# Patient Record
Sex: Female | Born: 1965 | Race: White | Hispanic: No | Smoking: Former smoker
Health system: Southern US, Community
[De-identification: ages and names within clinical notes are randomized; demographics above are authoritative.]

## PROBLEM LIST (undated history)

## (undated) DIAGNOSIS — D509 Iron deficiency anemia, unspecified: Secondary | ICD-10-CM

## (undated) DIAGNOSIS — K219 Gastro-esophageal reflux disease without esophagitis: Secondary | ICD-10-CM

## (undated) HISTORY — PX: FRACTURE SURGERY: SHX138

---

## 2005-04-14 ENCOUNTER — Encounter: Payer: Self-pay | Admitting: Family Medicine

## 2005-06-10 ENCOUNTER — Ambulatory Visit: Payer: Self-pay | Admitting: Family Medicine

## 2005-06-17 ENCOUNTER — Ambulatory Visit: Payer: Self-pay | Admitting: Family Medicine

## 2005-08-18 ENCOUNTER — Ambulatory Visit: Payer: Self-pay | Admitting: Family Medicine

## 2005-08-25 ENCOUNTER — Other Ambulatory Visit: Admission: RE | Admit: 2005-08-25 | Discharge: 2005-08-25 | Payer: Self-pay | Admitting: Family Medicine

## 2005-08-25 ENCOUNTER — Ambulatory Visit: Payer: Self-pay | Admitting: Family Medicine

## 2006-12-23 ENCOUNTER — Encounter: Payer: Self-pay | Admitting: Family Medicine

## 2006-12-23 DIAGNOSIS — E785 Hyperlipidemia, unspecified: Secondary | ICD-10-CM

## 2006-12-23 DIAGNOSIS — F17200 Nicotine dependence, unspecified, uncomplicated: Secondary | ICD-10-CM

## 2006-12-23 DIAGNOSIS — N949 Unspecified condition associated with female genital organs and menstrual cycle: Secondary | ICD-10-CM

## 2014-01-17 ENCOUNTER — Emergency Department (HOSPITAL_BASED_OUTPATIENT_CLINIC_OR_DEPARTMENT_OTHER): Payer: Self-pay

## 2014-01-17 ENCOUNTER — Emergency Department (HOSPITAL_BASED_OUTPATIENT_CLINIC_OR_DEPARTMENT_OTHER)
Admission: EM | Admit: 2014-01-17 | Discharge: 2014-01-17 | Disposition: A | Discharge status: Home / Self Care | Payer: Self-pay | Attending: Emergency Medicine | Admitting: Emergency Medicine

## 2014-01-17 ENCOUNTER — Encounter (HOSPITAL_BASED_OUTPATIENT_CLINIC_OR_DEPARTMENT_OTHER): Payer: Self-pay | Admitting: Emergency Medicine

## 2014-01-17 DIAGNOSIS — Y929 Unspecified place or not applicable: Secondary | ICD-10-CM | POA: Insufficient documentation

## 2014-01-17 DIAGNOSIS — W06XXXA Fall from bed, initial encounter: Secondary | ICD-10-CM | POA: Insufficient documentation

## 2014-01-17 DIAGNOSIS — Y93E5 Activity, floor mopping and cleaning: Secondary | ICD-10-CM | POA: Insufficient documentation

## 2014-01-17 DIAGNOSIS — Z720 Tobacco use: Secondary | ICD-10-CM | POA: Insufficient documentation

## 2014-01-17 DIAGNOSIS — Z8719 Personal history of other diseases of the digestive system: Secondary | ICD-10-CM | POA: Insufficient documentation

## 2014-01-17 DIAGNOSIS — S52501A Unspecified fracture of the lower end of right radius, initial encounter for closed fracture: Secondary | ICD-10-CM | POA: Insufficient documentation

## 2014-01-17 HISTORY — DX: Gastro-esophageal reflux disease without esophagitis: K21.9

## 2014-01-17 MED ORDER — PROMETHAZINE HCL 25 MG PO TABS: 25.0000 mg | ORAL_TABLET | Freq: Four times a day (QID) | ORAL | Status: DC | PRN

## 2014-01-17 MED ORDER — HYDROMORPHONE HCL 1 MG/ML IJ SOLN
2.0000 mg | Freq: Once | INTRAMUSCULAR | Status: AC
  Administered 2014-01-17: 2 mg via INTRAMUSCULAR
  Filled 2014-01-17: qty 2

## 2014-01-17 MED ORDER — OXYCODONE-ACETAMINOPHEN 5-325 MG PO TABS
2.0000 | ORAL_TABLET | Freq: Once | ORAL | Status: AC
  Administered 2014-01-17: 2 via ORAL
  Filled 2014-01-17: qty 2

## 2014-01-17 MED ORDER — OXYCODONE-ACETAMINOPHEN 5-325 MG PO TABS: 2.0000 | ORAL_TABLET | ORAL | Status: DC | PRN

## 2014-01-17 NOTE — ED Notes (Signed)
Pt d/c with ride- taken via wheelchair to pharmacy to pick up meds and then to car- ice pack given for home use

## 2014-01-17 NOTE — Discharge Instructions (Signed)
Wear splint as applied.  Followup with Dr. Amanda PeaGramig tomorrow at 11 AM at his office, unless you hear otherwise.  Apply ice for 20 minutes every 2 hours while awake for the next 2 days.  Percocet as prescribed as needed for pain.   Radial Fracture You have a broken bone (fracture) of the forearm. This is the part of your arm between the elbow and your wrist. Your forearm is made up of two bones. These are the radius and ulna. Your fracture is in the radial shaft. This is the bone in your forearm located on the thumb side. A cast or splint is used to protect and keep your injured bone from moving. The cast or splint will be on generally for about 5 to 6 weeks, with individual variations. HOME CARE INSTRUCTIONS   Keep the injured part elevated while sitting or lying down. Keep the injury above the level of your heart (the center of the chest). This will decrease swelling and pain.  Apply ice to the injury for 15-20 minutes, 03-04 times per day while awake, for 2 days. Put the ice in a plastic bag and place a towel between the bag of ice and your cast or splint.  Move your fingers to avoid stiffness and minimize swelling.  If you have a plaster or fiberglass cast:  Do not try to scratch the skin under the cast using sharp or pointed objects.  Check the skin around the cast every day. You may put lotion on any red or sore areas.  Keep your cast dry and clean.  If you have a plaster splint:  Wear the splint as directed.  You may loosen the elastic around the splint if your fingers become numb, tingle, or turn cold or blue.  Do not put pressure on any part of your cast or splint. It may break. Rest your cast only on a pillow for the first 24 hours until it is fully hardened.  Your cast or splint can be protected during bathing with a plastic bag. Do not lower the cast or splint into water.  Only take over-the-counter or prescription medicines for pain, discomfort, or fever as directed by  your caregiver. SEEK IMMEDIATE MEDICAL CARE IF:   Your cast gets damaged or breaks.  You have more severe pain or swelling than you did before getting the cast.  You have severe pain when stretching your fingers.  There is a bad smell, new stains and/or pus-like (purulent) drainage coming from under the cast.  Your fingers or hand turn pale or blue and become cold or your loose feeling. Document Released: 09/11/2005 Document Revised: 06/23/2011 Document Reviewed: 12/08/2005 [facility] Patient Information 2015 [employer], [place]. This information is not intended to replace advice given to you by your health care provider. Make sure you discuss any questions you have with your health care provider.

## 2014-01-17 NOTE — ED Provider Notes (Signed)
CSN: [id number]     Arrival date & time 01/17/14  0905 History   First MD Initiated Contact with Patient 01/17/14 [phone]     Chief Complaint  Patient presents with  . Arm Injury    right     (Consider location/radiation/quality/duration/timing/severity/associated sxs/prior Treatment) HPI Comments: Patient is a 48 year old female who presents with complaints of right wrist injury. She states she was standing on the bed cleaning a ceiling fan when she fell on her outstretched hand. She now has pain, swelling, and bruising to her right wrist. She denies other injury. The injury occurred yesterday evening.  Patient is a 48 y.o. female presenting with arm injury. The history is provided by the patient.  Arm Injury Location:  Wrist Time since incident:  12 hours Injury: yes   Mechanism of injury: fall   Fall:    Fall occurred:  From a bed Wrist location:  R wrist Pain details:    Quality:  Sharp   Radiates to:  Does not radiate   Severity:  Severe   Onset quality:  Sudden   Timing:  Constant   Progression:  Worsening Chronicity:  New Relieved by:  Nothing Worsened by:  Movement   Past Medical History  Diagnosis Date  . GERD (gastroesophageal reflux disease)    History reviewed. No pertinent past surgical history. No family history on file. History  Substance Use Topics  . Smoking status: Current Every Day Smoker -- 0.50 packs/day    Types: Cigarettes  . Smokeless tobacco: Not on file  . Alcohol Use: Yes   OB History   Grav Para Term Preterm Abortions TAB SAB Ect Mult Living                 Review of Systems  All other systems reviewed and are negative.     Allergies  Review of patient's allergies indicates not on file.  Home Medications   Prior to Admission medications   Not on File   BP 157/93  Pulse 98  Temp(Src) 98.8 F (37.1 C) (Oral)  Resp 18  Ht 5\' 4"  (1.626 m)  Wt 135 lb (61.236 kg)  BMI 23.16 kg/m2  SpO2 99% Physical Exam  Nursing note and  vitals reviewed. Constitutional: She is oriented to person, place, and time. She appears well-developed and well-nourished. No distress.  HENT:  Head: Normocephalic and atraumatic.  Mouth/Throat: Oropharynx is clear and moist.  Neck: Normal range of motion. Neck supple.  Musculoskeletal:  The right wrist is noted to have obvious deformity. There is swelling and ecchymosis to the dorsum of the hand, and distal forearm. Capillary refill is brisk to all fingers. She is able to flex and extend all fingers and sensation is intact.  Neurological: She is alert and oriented to person, place, and time.  Skin: Skin is warm and dry. She is not diaphoretic.    ED Course  Procedures (including critical care time) Labs Review Labs Reviewed - No data to display  Imaging Review No results found.   EKG Interpretation None      MDM   Final diagnoses:  None    In consultation with Dr. Amanda PeaGramig, will place in a volar wrist splint and follow up in the office for operative repair.        Geoffery Lyonsouglas Torrance Frech, MD 01/17/14 2329

## 2014-01-17 NOTE — ED Notes (Signed)
Pt vomited immediately after receiving percocet 

## 2014-01-17 NOTE — ED Notes (Signed)
Pt to Ed co of right arm injury due to fall occurred yesterday. Pt was cleaning ceiling standing ob bed, then pt fell landing on right arm. Pt presents with obvious deformity and swelling to wright wrist. Pt co of nausea and pain 10/10. Pt took Ibuprofen 800 mg at 4 am.

## 2014-01-19 ENCOUNTER — Encounter (HOSPITAL_COMMUNITY)
Admission: EM | Disposition: A | Discharge status: Home / Self Care | Payer: Self-pay | Source: Home / Self Care | Attending: Emergency Medicine

## 2014-01-19 ENCOUNTER — Emergency Department (HOSPITAL_COMMUNITY): Payer: Self-pay | Admitting: Certified Registered Nurse Anesthetist

## 2014-01-19 ENCOUNTER — Encounter (HOSPITAL_COMMUNITY): Payer: Self-pay | Admitting: Emergency Medicine

## 2014-01-19 ENCOUNTER — Encounter (HOSPITAL_COMMUNITY): Payer: Self-pay | Admitting: Certified Registered Nurse Anesthetist

## 2014-01-19 ENCOUNTER — Observation Stay (HOSPITAL_COMMUNITY)
Admission: EM | Admit: 2014-01-19 | Discharge: 2014-01-20 | Disposition: A | Discharge status: Home / Self Care | Payer: Self-pay | Attending: Orthopedic Surgery | Admitting: Orthopedic Surgery

## 2014-01-19 DIAGNOSIS — Y92098 Other place in other non-institutional residence as the place of occurrence of the external cause: Secondary | ICD-10-CM | POA: Insufficient documentation

## 2014-01-19 DIAGNOSIS — S52591A Other fractures of lower end of right radius, initial encounter for closed fracture: Principal | ICD-10-CM | POA: Insufficient documentation

## 2014-01-19 DIAGNOSIS — W1789XA Other fall from one level to another, initial encounter: Secondary | ICD-10-CM | POA: Insufficient documentation

## 2014-01-19 DIAGNOSIS — S62101D Fracture of unspecified carpal bone, right wrist, subsequent encounter for fracture with routine healing: Secondary | ICD-10-CM

## 2014-01-19 DIAGNOSIS — Y93E5 Activity, floor mopping and cleaning: Secondary | ICD-10-CM | POA: Insufficient documentation

## 2014-01-19 DIAGNOSIS — Y998 Other external cause status: Secondary | ICD-10-CM | POA: Insufficient documentation

## 2014-01-19 DIAGNOSIS — S52509A Unspecified fracture of the lower end of unspecified radius, initial encounter for closed fracture: Secondary | ICD-10-CM | POA: Diagnosis present

## 2014-01-19 DIAGNOSIS — K219 Gastro-esophageal reflux disease without esophagitis: Secondary | ICD-10-CM | POA: Insufficient documentation

## 2014-01-19 DIAGNOSIS — F17210 Nicotine dependence, cigarettes, uncomplicated: Secondary | ICD-10-CM | POA: Insufficient documentation

## 2014-01-19 HISTORY — PX: ORIF WRIST FRACTURE: SHX2133

## 2014-01-19 HISTORY — DX: Iron deficiency anemia, unspecified: D50.9

## 2014-01-19 LAB — BASIC METABOLIC PANEL
ANION GAP: 16 — AB (ref 5–15)
BUN: 8 mg/dL (ref 6–23)
CHLORIDE: 98 meq/L (ref 96–112)
CO2: 24 meq/L (ref 19–32)
Calcium: 9.2 mg/dL (ref 8.4–10.5)
Creatinine, Ser: 0.51 mg/dL (ref 0.50–1.10)
GFR calc Af Amer: >90 mL/min (ref >90)
GFR calc non Af Amer: >90 mL/min (ref >90)
Glucose, Bld: 93 mg/dL (ref 70–99)
Potassium: 3.4 mEq/L — ABNORMAL LOW (ref 3.7–5.3)
Sodium: 138 mEq/L (ref 137–147)

## 2014-01-19 LAB — CBC
HEMATOCRIT: 40.7 % (ref 36.0–46.0)
Hemoglobin: 14.1 g/dL (ref 12.0–15.0)
MCH: 37 pg — ABNORMAL HIGH (ref 26.0–34.0)
MCHC: 34.6 g/dL (ref 30.0–36.0)
MCV: 106.8 fL — ABNORMAL HIGH (ref 78.0–100.0)
PLATELETS: 201 10*3/uL (ref 150–400)
RBC: 3.81 MIL/uL — AB (ref 3.87–5.11)
RDW: 12.7 % (ref 11.5–15.5)
WBC: 7 10*3/uL (ref 4.0–10.5)

## 2014-01-19 SURGERY — OPEN REDUCTION INTERNAL FIXATION (ORIF) WRIST FRACTURE
Anesthesia: General | Laterality: Right

## 2014-01-19 MED ORDER — SUCCINYLCHOLINE CHLORIDE 20 MG/ML IJ SOLN
INTRAMUSCULAR | Status: AC
  Filled 2014-01-19: qty 1

## 2014-01-19 MED ORDER — LACTATED RINGERS IV SOLN
INTRAVENOUS | Status: DC
Start: 2014-01-19 — End: 2014-01-20
  Administered 2014-01-19: 17:00:00 via INTRAVENOUS

## 2014-01-19 MED ORDER — EPHEDRINE SULFATE 50 MG/ML IJ SOLN
INTRAMUSCULAR | Status: DC | PRN
  Administered 2014-01-19 (×5): 10 mg via INTRAVENOUS

## 2014-01-19 MED ORDER — MORPHINE SULFATE 4 MG/ML IJ SOLN
4.0000 mg | INTRAMUSCULAR | Status: DC | PRN
  Administered 2014-01-19: 4 mg via INTRAVENOUS
  Filled 2014-01-19: qty 1

## 2014-01-19 MED ORDER — CEFAZOLIN SODIUM 1-5 GM-% IV SOLN
1.0000 g | Freq: Three times a day (TID) | INTRAVENOUS | Status: DC
  Administered 2014-01-20: 1 g via INTRAVENOUS
  Filled 2014-01-19 (×4): qty 50

## 2014-01-19 MED ORDER — FAMOTIDINE 20 MG PO TABS
20.0000 mg | ORAL_TABLET | Freq: Two times a day (BID) | ORAL | Status: DC | PRN
  Filled 2014-01-19: qty 1

## 2014-01-19 MED ORDER — MIDAZOLAM HCL 5 MG/5ML IJ SOLN
INTRAMUSCULAR | Status: DC | PRN
  Administered 2014-01-19: 2 mg via INTRAVENOUS

## 2014-01-19 MED ORDER — PROMETHAZINE HCL 25 MG RE SUPP: 12.5000 mg | Freq: Four times a day (QID) | RECTAL | Status: DC | PRN

## 2014-01-19 MED ORDER — VITAMIN C 500 MG PO TABS
1000.0000 mg | ORAL_TABLET | Freq: Every day | ORAL | Status: DC
  Administered 2014-01-19 – 2014-01-20 (×2): 1000 mg via ORAL
  Filled 2014-01-19 (×3): qty 2

## 2014-01-19 MED ORDER — FENTANYL CITRATE 0.05 MG/ML IJ SOLN
INTRAMUSCULAR | Status: DC | PRN
  Administered 2014-01-19: 150 ug via INTRAVENOUS

## 2014-01-19 MED ORDER — LACTATED RINGERS IV SOLN
INTRAVENOUS | Status: DC
  Administered 2014-01-19: 19:00:00 via INTRAVENOUS

## 2014-01-19 MED ORDER — LIDOCAINE HCL (CARDIAC) 20 MG/ML IV SOLN
INTRAVENOUS | Status: AC
  Filled 2014-01-19: qty 5

## 2014-01-19 MED ORDER — MORPHINE SULFATE 4 MG/ML IJ SOLN: 4.0000 mg | INTRAMUSCULAR | Status: DC | PRN

## 2014-01-19 MED ORDER — DEXAMETHASONE SODIUM PHOSPHATE 4 MG/ML IJ SOLN
INTRAMUSCULAR | Status: AC
  Filled 2014-01-19: qty 1

## 2014-01-19 MED ORDER — DOCUSATE SODIUM 100 MG PO CAPS
100.0000 mg | ORAL_CAPSULE | Freq: Two times a day (BID) | ORAL | Status: DC
  Administered 2014-01-19 – 2014-01-20 (×2): 100 mg via ORAL
  Filled 2014-01-19 (×2): qty 1

## 2014-01-19 MED ORDER — ONDANSETRON HCL 4 MG/2ML IJ SOLN
INTRAMUSCULAR | Status: DC | PRN
  Administered 2014-01-19: 4 mg via INTRAVENOUS

## 2014-01-19 MED ORDER — OXYCODONE HCL 5 MG PO TABS
5.0000 mg | ORAL_TABLET | ORAL | Status: DC | PRN
  Administered 2014-01-20 (×2): 5 mg via ORAL
  Filled 2014-01-19 (×2): qty 1

## 2014-01-19 MED ORDER — MIDAZOLAM HCL 2 MG/2ML IJ SOLN
INTRAMUSCULAR | Status: AC
  Filled 2014-01-19: qty 2

## 2014-01-19 MED ORDER — FENTANYL CITRATE 0.05 MG/ML IJ SOLN
INTRAMUSCULAR | Status: AC
  Filled 2014-01-19: qty 5

## 2014-01-19 MED ORDER — PHENYLEPHRINE HCL 10 MG/ML IJ SOLN
INTRAMUSCULAR | Status: DC | PRN
  Administered 2014-01-19: 80 ug via INTRAVENOUS

## 2014-01-19 MED ORDER — PHENYLEPHRINE 40 MCG/ML (10ML) SYRINGE FOR IV PUSH (FOR BLOOD PRESSURE SUPPORT)
PREFILLED_SYRINGE | INTRAVENOUS | Status: AC
  Filled 2014-01-19: qty 10

## 2014-01-19 MED ORDER — ONDANSETRON HCL 4 MG PO TABS: 4.0000 mg | ORAL_TABLET | Freq: Four times a day (QID) | ORAL | Status: DC | PRN

## 2014-01-19 MED ORDER — PROPOFOL 10 MG/ML IV BOLUS
INTRAVENOUS | Status: DC | PRN
  Administered 2014-01-19: 200 mg via INTRAVENOUS

## 2014-01-19 MED ORDER — LIDOCAINE HCL (CARDIAC) 20 MG/ML IV SOLN
INTRAVENOUS | Status: DC | PRN
  Administered 2014-01-19: 80 mg via INTRAVENOUS

## 2014-01-19 MED ORDER — PROPOFOL 10 MG/ML IV BOLUS
INTRAVENOUS | Status: AC
  Filled 2014-01-19: qty 20

## 2014-01-19 MED ORDER — 0.9 % SODIUM CHLORIDE (POUR BTL) OPTIME
TOPICAL | Status: DC | PRN
  Administered 2014-01-19: 1000 mL

## 2014-01-19 MED ORDER — ONDANSETRON HCL 4 MG/2ML IJ SOLN: 4.0000 mg | Freq: Once | INTRAMUSCULAR | Status: DC | PRN

## 2014-01-19 MED ORDER — LACTATED RINGERS IV SOLN
INTRAVENOUS | Status: DC | PRN
  Administered 2014-01-19 (×2): via INTRAVENOUS

## 2014-01-19 MED ORDER — BUPIVACAINE-EPINEPHRINE (PF) 0.5% -1:200000 IJ SOLN
INTRAMUSCULAR | Status: DC | PRN
  Administered 2014-01-19: 25 mL via PERINEURAL

## 2014-01-19 MED ORDER — BUPIVACAINE HCL (PF) 0.25 % IJ SOLN
INTRAMUSCULAR | Status: AC
  Filled 2014-01-19: qty 30

## 2014-01-19 MED ORDER — DEXAMETHASONE SODIUM PHOSPHATE 4 MG/ML IJ SOLN
INTRAMUSCULAR | Status: DC | PRN
  Administered 2014-01-19: 4 mg via INTRAVENOUS

## 2014-01-19 MED ORDER — FENTANYL CITRATE 0.05 MG/ML IJ SOLN: 25.0000 ug | INTRAMUSCULAR | Status: DC | PRN

## 2014-01-19 MED ORDER — ONDANSETRON HCL 4 MG/2ML IJ SOLN
INTRAMUSCULAR | Status: AC
  Filled 2014-01-19: qty 2

## 2014-01-19 MED ORDER — METHOCARBAMOL 500 MG PO TABS
500.0000 mg | ORAL_TABLET | Freq: Four times a day (QID) | ORAL | Status: DC | PRN
  Administered 2014-01-20: 500 mg via ORAL
  Filled 2014-01-19: qty 1

## 2014-01-19 MED ORDER — ONDANSETRON HCL 4 MG/2ML IJ SOLN: 4.0000 mg | Freq: Four times a day (QID) | INTRAMUSCULAR | Status: DC | PRN

## 2014-01-19 MED ORDER — CEFAZOLIN SODIUM-DEXTROSE 2-3 GM-% IV SOLR
INTRAVENOUS | Status: DC | PRN
  Administered 2014-01-19: 2 g via INTRAVENOUS

## 2014-01-19 MED ORDER — ONDANSETRON HCL 4 MG/2ML IJ SOLN
4.0000 mg | Freq: Once | INTRAMUSCULAR | Status: AC
  Administered 2014-01-19: 4 mg via INTRAVENOUS
  Filled 2014-01-19: qty 2

## 2014-01-19 MED ORDER — METHOCARBAMOL 1000 MG/10ML IJ SOLN
500.0000 mg | Freq: Four times a day (QID) | INTRAVENOUS | Status: DC | PRN
  Filled 2014-01-19: qty 5

## 2014-01-19 MED ORDER — CEFAZOLIN SODIUM 1-5 GM-% IV SOLN
INTRAVENOUS | Status: AC
  Filled 2014-01-19: qty 50

## 2014-01-19 MED ORDER — CEFAZOLIN SODIUM 1-5 GM-% IV SOLN
1.0000 g | INTRAVENOUS | Status: AC
  Administered 2014-01-19: 1 g via INTRAVENOUS

## 2014-01-19 MED ORDER — ALPRAZOLAM 0.5 MG PO TABS
0.5000 mg | ORAL_TABLET | Freq: Four times a day (QID) | ORAL | Status: DC | PRN
  Administered 2014-01-19: 0.5 mg via ORAL
  Filled 2014-01-19: qty 1

## 2014-01-19 SURGICAL SUPPLY — 58 items
BANDAGE ELASTIC 3 VELCRO ST LF (GAUZE/BANDAGES/DRESSINGS) ×3 IMPLANT
BANDAGE ELASTIC 4 VELCRO ST LF (GAUZE/BANDAGES/DRESSINGS) ×3 IMPLANT
BIT DRILL 2.2 SS TIBIAL (BIT) ×3 IMPLANT
BLADE SURG ROTATE 9660 (MISCELLANEOUS) IMPLANT
BNDG ESMARK 4X9 LF (GAUZE/BANDAGES/DRESSINGS) ×3 IMPLANT
BNDG GAUZE ELAST 4 BULKY (GAUZE/BANDAGES/DRESSINGS) ×3 IMPLANT
CORDS BIPOLAR (ELECTRODE) ×3 IMPLANT
COVER SURGICAL LIGHT HANDLE (MISCELLANEOUS) ×3 IMPLANT
CUFF TOURNIQUET SINGLE 18IN (TOURNIQUET CUFF) ×3 IMPLANT
CUFF TOURNIQUET SINGLE 24IN (TOURNIQUET CUFF) IMPLANT
DRAIN TLS ROUND 10FR (DRAIN) IMPLANT
DRAPE OEC MINIVIEW 54X84 (DRAPES) IMPLANT
DRAPE SURG 17X23 STRL (DRAPES) ×3 IMPLANT
DRSG ADAPTIC 3X8 NADH LF (GAUZE/BANDAGES/DRESSINGS) ×3 IMPLANT
GAUZE SPONGE 4X4 12PLY STRL (GAUZE/BANDAGES/DRESSINGS) ×3 IMPLANT
GAUZE XEROFORM 1X8 LF (GAUZE/BANDAGES/DRESSINGS) ×3 IMPLANT
GAUZE XEROFORM 5X9 LF (GAUZE/BANDAGES/DRESSINGS) ×3 IMPLANT
GLOVE BIOGEL M STRL SZ7.5 (GLOVE) ×3 IMPLANT
GLOVE SS BIOGEL STRL SZ 8 (GLOVE) ×1 IMPLANT
GLOVE SUPERSENSE BIOGEL SZ 8 (GLOVE) ×2
GOWN STRL REUS W/ TWL LRG LVL3 (GOWN DISPOSABLE) ×3 IMPLANT
GOWN STRL REUS W/ TWL XL LVL3 (GOWN DISPOSABLE) ×3 IMPLANT
GOWN STRL REUS W/TWL LRG LVL3 (GOWN DISPOSABLE) ×6
GOWN STRL REUS W/TWL XL LVL3 (GOWN DISPOSABLE) ×6
KIT BASIN OR (CUSTOM PROCEDURE TRAY) ×3 IMPLANT
KIT ROOM TURNOVER OR (KITS) ×3 IMPLANT
LOOP VESSEL MAXI BLUE (MISCELLANEOUS) IMPLANT
MANIFOLD NEPTUNE II (INSTRUMENTS) ×3 IMPLANT
NEEDLE 22X1 1/2 (OR ONLY) (NEEDLE) IMPLANT
NS IRRIG 1000ML POUR BTL (IV SOLUTION) ×3 IMPLANT
PACK ORTHO EXTREMITY (CUSTOM PROCEDURE TRAY) ×3 IMPLANT
PAD ARMBOARD 7.5X6 YLW CONV (MISCELLANEOUS) ×6 IMPLANT
PAD CAST 3X4 CTTN HI CHSV (CAST SUPPLIES) ×1 IMPLANT
PAD CAST 4YDX4 CTTN HI CHSV (CAST SUPPLIES) ×1 IMPLANT
PADDING CAST COTTON 3X4 STRL (CAST SUPPLIES) ×2
PADDING CAST COTTON 4X4 STRL (CAST SUPPLIES) ×2
PEG LOCKING SMOOTH 2.2X18 (Peg) ×12 IMPLANT
PLATE NARROW DVR RIGHT (Plate) ×3 IMPLANT
SCREW LOCK 12X2.7X 3 LD (Screw) ×2 IMPLANT
SCREW LOCK 14X2.7X 3 LD TPR (Screw) ×2 IMPLANT
SCREW LOCKING 2.7X12MM (Screw) ×4 IMPLANT
SCREW LOCKING 2.7X14 (Screw) ×4 IMPLANT
SCREW LOCKING 2.7X15MM (Screw) ×6 IMPLANT
SPLINT FIBERGLASS 4X30 (CAST SUPPLIES) ×3 IMPLANT
SPONGE GAUZE 4X4 12PLY STER LF (GAUZE/BANDAGES/DRESSINGS) ×3 IMPLANT
SPONGE LAP 4X18 X RAY DECT (DISPOSABLE) IMPLANT
SUT MNCRL AB 4-0 PS2 18 (SUTURE) ×3 IMPLANT
SUT PROLENE 3 0 PS 2 (SUTURE) IMPLANT
SUT PROLENE 4 0 PS 2 18 (SUTURE) ×3 IMPLANT
SUT VIC AB 3-0 FS2 27 (SUTURE) ×3 IMPLANT
SYR CONTROL 10ML LL (SYRINGE) IMPLANT
SYSTEM CHEST DRAIN TLS 7FR (DRAIN) ×3 IMPLANT
TOWEL OR 17X24 6PK STRL BLUE (TOWEL DISPOSABLE) ×3 IMPLANT
TOWEL OR 17X26 10 PK STRL BLUE (TOWEL DISPOSABLE) ×3 IMPLANT
TUBE CONNECTING 12'X1/4 (SUCTIONS) ×1
TUBE CONNECTING 12X1/4 (SUCTIONS) ×2 IMPLANT
TUBE EVACUATION TLS (MISCELLANEOUS) ×3 IMPLANT
WATER STERILE IRR 1000ML POUR (IV SOLUTION) ×3 IMPLANT

## 2014-01-19 NOTE — Anesthesia Preprocedure Evaluation (Addendum)
Anesthesia Evaluation  Patient identified by MRN, date of birth, ID band Patient awake    Reviewed: Allergy & Precautions, H&P , NPO status , Patient's Chart, lab work & pertinent test results  Airway Mallampati: I TM Distance: >3 FB Neck ROM: Full    Dental  (+) Teeth Intact, Dental Advisory Given   Pulmonary Current Smoker,  breath sounds clear to auscultation        Cardiovascular Rhythm:Regular Rate:Normal     Neuro/Psych    GI/Hepatic GERD-  Medicated and Controlled,  Endo/Other    Renal/GU      Musculoskeletal   Abdominal   Peds  Hematology   Anesthesia Other Findings   Reproductive/Obstetrics                          Anesthesia Physical Anesthesia Plan  ASA: II and emergent  Anesthesia Plan: General   Post-op Pain Management: MAC Combined w/ Regional for Post-op pain   Induction: Intravenous  Airway Management Planned: LMA  Additional Equipment:   Intra-op Plan:   Post-operative Plan:   Informed Consent: I have reviewed the patients History and Physical, chart, labs and discussed the procedure including the risks, benefits and alternatives for the proposed anesthesia with the patient or authorized representative who has indicated his/her understanding and acceptance.   Dental advisory given  Plan Discussed with: CRNA, Anesthesiologist and Surgeon  Anesthesia Plan Comments:        Anesthesia Quick Evaluation

## 2014-01-19 NOTE — ED Notes (Signed)
Dr. Amanda PeaGramig paged by Letitia Libraracey Rhoda

## 2014-01-19 NOTE — Transfer of Care (Signed)
Immediate Anesthesia Transfer of Care Note  Patient: Kayla Bullock  Procedure(s) Performed: Procedure(s) with comments: OPEN REDUCTION INTERNAL FIXATION (ORIF) WRIST FRACTURE (Right) - DVR plates  Patient Location: PACU  Anesthesia Type:General  Level of Consciousness: awake, alert  and oriented  Airway & Oxygen Therapy: Patient Spontanous Breathing and Patient connected to nasal cannula oxygen  Post-op Assessment: Report given to PACU RN, Post -op Vital signs reviewed and stable and Patient moving all extremities  Post vital signs: Reviewed and stable  Complications: No apparent anesthesia complications

## 2014-01-19 NOTE — ED Notes (Addendum)
Patient states she was told by [facility] hospital to come in through ED and that Dr. Amanda PeaGramig would come get patient for surgery.  Patient states she is supposed to have surgery at 3 pm.  Patient states is supposed to come through ED because she has no insurance.

## 2014-01-19 NOTE — H&P (Signed)
Kayla Bullock is an 48 y.o. female.   Chief Complaint: I broke my wrist HPI: The patient is a pleasant 40 absence emergency room setting for evaluation of her right upper extremity. She sustained an injury last week when she fell while cleaning a ceiling fell and. She she landed onto her right upper extremity she had noted pain slight deformity about the right wrist she was seen in the emergency room setting she was placed into a splint she's had continued pain she returns today for further evaluation. Radiographs do show a comminuted displaced distal radius fracture. We were consuledt in regards to the upper extremity. 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 Discussed with her the nature of the upper extremity and given the fracture parameters, and need for surgical intervention in terms of open reduction internal fixation. Preoperative labs are reviewed. She denies injury to the remaining portion of her upper extremity or lower extremities.  Past Medical History  Diagnosis Date  . GERD (gastroesophageal reflux disease)     History reviewed. No pertinent past surgical history.  No family history on file. Social History:  reports that she has been smoking Cigarettes.  She has been smoking about 0.50 packs per day. She does not have any smokeless tobacco history on file. She reports that she drinks alcohol. She reports that she does not use illicit drugs.  Allergies: No Known Allergies   (Not in a hospital admission)  Results for orders placed during the hospital encounter of 01/19/14 (from the past 48 hour(s))  CBC     Status: Abnormal   Collection Time    01/19/14  1:28 PM      Result Value Ref Range   WBC 7.0  4.0 - 10.5 K/uL   RBC 3.81 (*) 3.87 - 5.11 MIL/uL   Hemoglobin 14.1  12.0 - 15.0 g/dL   HCT 40.7  36.0 - 46.0 %   MCV 106.8 (*) 78.0 - 100.0 fL   MCH 37.0 (*) 26.0 - 34.0 pg   MCHC 34.6  30.0 - 36.0 g/dL   RDW  12.7  11.5 - 15.5 %   Platelets 201  150 - 400 K/uL  BASIC METABOLIC PANEL     Status: Abnormal   Collection Time    01/19/14  1:28 PM      Result Value Ref Range   Sodium 138  137 - 147 mEq/L   Potassium 3.4 (*) 3.7 - 5.3 mEq/L   Chloride 98  96 - 112 mEq/L   CO2 24  19 - 32 mEq/L   Glucose, Bld 93  70 - 99 mg/dL   BUN 8  6 - 23 mg/dL   Creatinine, Ser 0.51  0.50 - 1.10 mg/dL   Calcium 9.2  8.4 - 10.5 mg/dL   GFR calc non Af Amer >90  >90 mL/min   GFR calc Af Amer >90  >90 mL/min   Comment: (NOTE)     The eGFR has been calculated using the CKD EPI equation.     This calculation has not been validated in all clinical situations.     eGFR's persistently <90 mL/min signify possible Chronic Kidney     Disease.   Anion gap 16 (*) 5 - 15   No results found.  Review of Systems  Constitutional: Negative.   HENT: Negative.   Eyes: Negative.   Respiratory: Negative.   Cardiovascular: Negative.   Gastrointestinal: Negative.  Musculoskeletal:       See history of present illness  Skin: Negative.   Neurological: Negative.   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 rate 12, height _0  (1.626 m), weight 61.236 kg (135 lb), SpO2 98.00%. Physical Exam  she is pleasant in no acute distress, alert and oriented x3. Significant other accompanies her in the room HEENT: Atraumatic normocephalic Chest: Expansions are present respirations are nonlabored Abdomen nontender Left upper extremity has normal alignment strength and stability Right upper extremity she has a long splint applied she has ecchymosis about the digits and dorsal aspect of the hand as well as the forearm elbow is nontender over the lateral aspect flexion and extension of the of their present shoulders nontender Lower extremity showed normal  alignment strength stability Assessment/Plan Comminuted displaced right distal radius fracture .Marland KitchenWe are planning surgery for your upper extremity. The risk and benefits of surgery include risk of bleeding infection anesthesia damage to normal structures and failure of the surgery to accomplish its intended goals of relieving symptoms and restoring function with this in mind we'll going to proceed. I have specifically discussed with the patient the pre-and postoperative regime and the does and don'ts and risk and benefits in great detail. Risk and benefits of surgery also include risk of dystrophy chronic nerve pain failure of the healing process to go onto completion and other inherent risks of surgery The relavent the pathophysiology of the disease/injury process, as well as the alternatives for treatment and postoperative course of action has been discussed in great detail with the patient who desires to proceed.  We will do everything in our power to help you (the patient) restore function to the upper extremity. Is a pleasure to see this patient today.   Tayllor Breitenstein L 01/19/2014, 4:35 PM

## 2014-01-19 NOTE — ED Provider Notes (Signed)
CSN: [id number]     Arrival date & time 01/19/14  1250 History   First MD Initiated Contact with Patient 01/19/14 1316     Chief Complaint  Patient presents with  . Wrist Injury      HPI  Patient presents with a wrist fracture. Fell 2 days ago. Referred to Dr. Amanda PeaGramig. Plan for operative repair. Refer to the emergency room for evaluation prior to going to the OR this afternoon. His right wrist and a splint was placed 2 days ago and emergency room. No other significant past medical history.  Past Medical History  Diagnosis Date  . GERD (gastroesophageal reflux disease)    History reviewed. No pertinent past surgical history. No family history on file. History  Substance Use Topics  . Smoking status: Current Every Day Smoker -- 0.50 packs/day    Types: Cigarettes  . Smokeless tobacco: Not on file  . Alcohol Use: Yes   OB History   Grav Para Term Preterm Abortions TAB SAB Ect Mult Living                 Review of Systems  Constitutional: Negative for fever, chills, diaphoresis, appetite change and fatigue.  HENT: Negative for mouth sores, sore throat and trouble swallowing.   Eyes: Negative for visual disturbance.  Respiratory: Negative for cough, chest tightness, shortness of breath and wheezing.   Cardiovascular: Negative for chest pain.  Gastrointestinal: Negative for nausea, vomiting, abdominal pain, diarrhea and abdominal distention.  Endocrine: Negative for polydipsia, polyphagia and polyuria.  Genitourinary: Negative for dysuria, frequency and hematuria.  Musculoskeletal: Negative for gait problem.       Right wrist pain. Splinted.  Skin: Negative for color change, pallor and rash.  Neurological: Negative for dizziness, syncope, light-headedness and headaches.  Hematological: Does not bruise/bleed easily.  Psychiatric/Behavioral: Negative for behavioral problems and confusion.      Allergies  Review of patient's allergies indicates no known allergies.  Home  Medications   Prior to Admission medications   Medication Sig Start Date End Date Taking? Authorizing Provider  oxyCODONE-acetaminophen (PERCOCET/ROXICET) 5-325 MG per tablet Take 1-2 tablets by mouth every 4 (four) hours as needed for moderate pain or severe pain.   Yes Historical Provider, MD  promethazine (PHENERGAN) 25 MG tablet Take 1 tablet (25 mg total) by mouth every 6 (six) hours as needed for nausea. 01/17/14  Yes Geoffery Lyonsouglas Delo, MD   BP 138/91  Pulse 80  Temp(Src) 98 F (36.7 C) (Oral)  Resp 10  Ht 5\' 4"  (1.626 m)  Wt 135 lb (61.236 kg)  BMI 23.16 kg/m2  SpO2 98% Physical Exam  Constitutional: She is oriented to person, place, and time. She appears well-developed and well-nourished. No distress.  HENT:  Head: Normocephalic.  Eyes: Conjunctivae are normal. Pupils are equal, round, and reactive to light. No scleral icterus.  Neck: Normal range of motion. Neck supple. No thyromegaly present.  Cardiovascular: Normal rate and regular rhythm.  Exam reveals no gallop and no friction rub.   No murmur heard. Pulmonary/Chest: Effort normal and breath sounds normal. No respiratory distress. She has no wheezes. She has no rales.  Abdominal: Soft. Bowel sounds are normal. She exhibits no distension. There is no tenderness. There is no rebound.  Musculoskeletal: Normal range of motion.       Arms: Neurological: She is alert and oriented to person, place, and time.  Skin: Skin is warm and dry. No rash noted.  Psychiatric: She has a normal mood and affect.  Her behavior is normal.    ED Course  Procedures (including critical care time) Labs Review Labs Reviewed  CBC - Abnormal; Notable for the following:    RBC 3.81 (*)    MCV 106.8 (*)    MCH 37.0 (*)    All other components within normal limits  BASIC METABOLIC PANEL - Abnormal; Notable for the following:    Potassium 3.4 (*)    Anion gap 16 (*)    All other components within normal limits    Imaging Review No results  found.   EKG Interpretation None      MDM   Final diagnoses:  Wrist fracture, right, with routine healing, subsequent encounter    Plan is to your with Dr. Amanda Pea for reduction.    Rolland Porter, MD 01/19/14 [phone]

## 2014-01-19 NOTE — ED Notes (Signed)
All belongings are with Dionne AnoKaren Lollar, pt's partner.

## 2014-01-19 NOTE — Anesthesia Procedure Notes (Addendum)
Anesthesia Regional Block:  Supraclavicular block  Pre-Anesthetic Checklist: ,, timeout performed, Correct Patient, Correct Site, Correct Laterality, Correct Procedure, Correct Position, site marked, Risks and benefits discussed,  Surgical consent,  Pre-op evaluation,  At surgeon's request and post-op pain management  Laterality: Right and Upper  Prep: chloraprep       Needles:  Injection technique: Single-shot  Needle Type: Echogenic Stimulator Needle     Needle Length: 5cm 5 cm Needle Gauge: 21 and 21 G    Additional Needles:  Procedures: ultrasound guided (picture in chart) Supraclavicular block Narrative:  Start time: 01/19/2014 5:18 PM End time: 01/19/2014 5:23 PM Injection made incrementally with aspirations every 5 mL.  Performed by: Personally  Anesthesiologist: Sheldon Silvanavid Crews   Procedure Name: LMA Insertion Date/Time: 01/19/2014 5:38 PM Performed by: Orvilla FusATO, Sharisa Toves A Pre-anesthesia Checklist: Patient identified, Timeout performed, Emergency Drugs available, Suction available and Patient being monitored Patient Re-evaluated:Patient Re-evaluated prior to inductionOxygen Delivery Method: Circle system utilized Preoxygenation: Pre-oxygenation with 100% oxygen Intubation Type: IV induction Ventilation: Mask ventilation without difficulty LMA: LMA inserted LMA Size: 4.0 Number of attempts: 1 Placement Confirmation: positive ETCO2 and breath sounds checked- equal and bilateral Tube secured with: Tape Dental Injury: Teeth and Oropharynx as per pre-operative assessment

## 2014-01-19 NOTE — ED Notes (Signed)
PA Buchannan at bedside.

## 2014-01-19 NOTE — Anesthesia Postprocedure Evaluation (Signed)
  Anesthesia Post-op Note  Patient: Kayla Bullock  Procedure(s) Performed: Procedure(s) with comments: OPEN REDUCTION INTERNAL FIXATION (ORIF) WRIST FRACTURE (Right) - DVR plates  Patient Location: PACU  Anesthesia Type:General and Regional  Level of Consciousness: awake and alert   Airway and Oxygen Therapy: Patient Spontanous Breathing  Post-op Pain: none  Post-op Assessment: Post-op Vital signs reviewed, Patient's Cardiovascular Status Stable, Respiratory Function Stable, Patent Airway, No signs of Nausea or vomiting and Pain level controlled  Post-op Vital Signs: Reviewed and stable  Last Vitals:  Filed Vitals:   01/19/14 1949  BP: 130/75  Pulse: 98  Temp: 37.1 C  Resp: 16    Complications: No apparent anesthesia complications

## 2014-01-19 NOTE — Op Note (Signed)
See op note# [id number] Terri Malerba Md

## 2014-01-20 ENCOUNTER — Encounter (HOSPITAL_COMMUNITY): Payer: Self-pay | Admitting: General Practice

## 2014-01-20 MED ORDER — METHOCARBAMOL 500 MG PO TABS: 500.0000 mg | ORAL_TABLET | Freq: Four times a day (QID) | ORAL | Status: DC | PRN

## 2014-01-20 MED ORDER — CEPHALEXIN 500 MG PO CAPS: 500.0000 mg | ORAL_CAPSULE | Freq: Four times a day (QID) | ORAL | Status: DC

## 2014-01-20 MED ORDER — OXYCODONE HCL 5 MG PO TABS: 5.0000 mg | ORAL_TABLET | ORAL | Status: DC | PRN

## 2014-01-20 NOTE — Discharge Instructions (Signed)
Please make sure to elevate move and massage her fingers. This is the most important thing you can do to control swelling.  Please obtain a cast cover and keep your bandage/splint clean and dry all times  Please do not sweat or perform any heavy workout activities while your stitches are in.  Keep bandage clean and dry.  Call for any problems.  No smoking.  Criteria for driving a car: you should be off your pain medicine for 7-8 hours, able to drive one handed(confident), thinking clearly and feeling able in your judgement to drive. Continue elevation as it will decrease swelling.  If instructed by MD move your fingers within the confines of the bandage/splint.  Use ice if instructed by your MD. Call immediately for any sudden loss of feeling in your hand/arm or change in functional abilities of the extremity.  We recommend that you to take vitamin C 1000 mg a day to promote healing we also recommend that if you require her pain medicine that he take a stool softener to prevent constipation as most pain medicines will have constipation side effects. We recommend either Peri-Colace or Senokot and recommend that you also consider adding MiraLAX to prevent the constipation affects from pain medicine if you are required to use them. These medicines are over the counter and maybe purchased at a local pharmacy.

## 2014-01-20 NOTE — Discharge Summary (Signed)
  Patient has been seen and examined. Patient has pain appropriate to his injury/process. Patient denies new complaints at this present time. I have discussed his care with nursing staff. Patient is appropriate and alert.  We reviewed vital signs and intake output which are stable.  The upper extremity is neurovascularly intact. Refill is normal. There is no signs of compartment syndrome. There is no signs of dystrophy. There is normal sensation.  I have spent a  great deal of time discussing range of motion edema control and other techniques to decrease edema and promote flexion extension of the fingers. Patient understands the importance of elevation range of motion massage and other measures to lessen pain and prevent swelling.  We have discussed with the patient shoulder range of motion to prevent adhesive capsulitis.  The remainder of the examination is normal today without complicating feature.  Drain was removed without difficulty  Patient will be discharged home. Will plan to see the patient back in the off as per discharge instructions (please see discharge instructions).  Patient had an uneventful hospital course. At the time of discharge patient is stable awake alert and oriented in no acute distress. Regular diet will be continued and has been tolerated. Patient will notify should have problems occur. There is no signs of DVT infection or other complication at this juncture.  All questions have been incurred and answered.   Discharge diagnosis status post ORIF right wrist fracture  Regular diet  RTC 12 days  All questions have been addressed encouraged and answered  Joory Gough M.D.

## 2014-01-20 NOTE — Op Note (Signed)
NAMGarnette Bullock:  [name], [name]              ACCOUNT NO.:  [id number]  MEDICAL RECORD NO.:  [id number]  LOCATION:  [id number]                        FACILITY:  [facility]  PHYSICIAN:  Dionne AnoWilliam M. Mallary Kreger, M.D.DATE OF BIRTH:  05-20-1965  DATE OF PROCEDURE: DATE OF DISCHARGE:                              OPERATIVE REPORT   PREOPERATIVE DIAGNOSIS:  Comminuted complex right distal radius fracture.  POSTOPERATIVE DIAGNOSIS:  Comminuted complex right distal radius fracture.  PROCEDURE: 1. Open reduction and internal fixation, distal radius fracture right     wrist. 2. AP, lateral, and oblique x-rays performed exam interpreted by     myself, right wrist.  SURGEON:  Dionne AnoWilliam M. Amanda PeaGramig, M.D.  ASSISTANT:  None.  COMPLICATION:  None.  ANESTHESIA:  General, preoperative block.  DRAINS:  One.  INDICATIONS:  This is a 48 year old female who presents for the above- mentioned operative intervention.  I have counseled in regard to risks and benefits of surgery and she desires to proceed.  All questions have been encouraged answered preoperatively.  OPERATIVE PROCEDURE:  The patient was seen was seen by myself and Anesthesia, taken to the operative suite, underwent smooth induction general anesthesia, laid supine, fully padded, prepped and draped in a sterile fashion with Betadine scrub and paint.  Final time-out was called.  Pre and postop check was secured.  Body parts well padded and the operation commenced with elevation of the tourniquet to 250 mmHg. Volar radial incision was made.  Dissection was carried down.  FCR tendon sheath was incised palmarly and dorsally.  Retraction of the carpal canal contents was accomplished ulnarly following this the pronator was identified and released.  The fracture was then accessed and reduced.  Following fracture reduction I then applied a narrow DVR plate from [employer].  I was able to achieve anatomic fixation and anatomic restoration of the volar tilt radial height  and radial inclination. There were no complicating features.  AP, lateral, and oblique x-rays were performed examined, interpreted by myself.  Stress radiography revealed excellent position of the radiocarpal and mid-carpal joints and stable secure positioning of the distal radioulnar joint.  There is no evidence of TFC instability.  I was quite pleased.  I irrigated copiously with a liter of saline, closed the pronator with Vicryl, and skin edge was then closed with Prolene with a drain placed deeply in the wound #7 TLS.  Short-arm splint was applied after rest sterile dressing was accomplished and the patient tolerated this well.  She had warm pink finger tips and a soft compartments.  There are no complications.  She will be admitted for IV antibiotics, general postop observation.  She will notify me should any problems occur.  I will look forward to seeing her back in the office in 12-14 days.  Once she is discharged from her overnight stay if things go smoothly which we anticipate.  These notes been discussed and all questions addressed.     Dionne AnoWilliam M. Amanda PeaGramig, M.D.     [facility]MED[facility]  D:  01/19/2014  T:  01/20/2014  Job:  [id number]

## 2014-01-20 NOTE — Evaluation (Signed)
Occupational Therapy Evaluation Patient Details Name: Kayla Bullock MRN: [id number] DOB: [birth year] Today's Date: 01/20/2014    History of Present Illness s/p ORIF right wrist fracture   Clinical Impression   Pt admitted with the above diagnosis. PTA pt was independent with ADLs and worked as a Interior and spatial designerhairdresser. Pt currently at supervision level for ADLs. ADL and edema control education provided to pt. Discussed safety with home setup (having shower seat available for use, using sturdy sink on left side to stabilize self during toilet transfers, etc.)      Follow Up Recommendations  Supervision - Intermittent;No OT follow up    Equipment Recommendations  None recommended by OT    Recommendations for Other Services       Precautions / Restrictions        Mobility Bed Mobility               General bed mobility comments: OOB  Transfers Overall transfer level: Modified independent                    Balance Overall balance assessment: No apparent balance deficits (not formally assessed)                                          ADL Overall ADL's : Needs assistance/impaired Eating/Feeding: Set up;Sitting   Grooming: Set up;Standing   Upper Body Bathing: Supervision/ safety;Sitting;Standing   Lower Body Bathing: Supervison/ safety;Sit to/from stand   Upper Body Dressing : Supervision/safety;Sitting   Lower Body Dressing: Supervision/safety;Sit to/from stand   Toilet Transfer: Supervision/safety;Ambulation;Comfort height toilet;Grab bars   Toileting- Clothing Manipulation and Hygiene: Supervision/safety;Sit to/from stand   Tub/ Shower Transfer: Supervision/safety;Ambulation;Shower seat   Functional mobility during ADLs: Supervision/safety General ADL Comments: ADL and edema control education provided to patient. Education on techniques for ADLs and edema control.      Vision                     Perception     Praxis       Pertinent Vitals/Pain Pain Assessment: No/denies pain     Hand Dominance Right   Extremity/Trunk Assessment Upper Extremity Assessment Upper Extremity Assessment: RUE deficits/detail RUE Deficits / Details: s/p right ORIF wrist fracture  RUE: Unable to fully assess due to immobilization   Lower Extremity Assessment Lower Extremity Assessment: Overall WFL for tasks assessed       Communication Communication Communication: No difficulties   Cognition Arousal/Alertness: Awake/alert Behavior During Therapy: WFL for tasks assessed/performed Overall Cognitive Status: Within Functional Limits for tasks assessed                     General Comments       Exercises       Shoulder Instructions      Home Living Family/patient expects to be discharged to:: Private residence Living Arrangements: Non-relatives/Friends Available Help at Discharge: Available 24 hours/day;Friend(s) Type of Home: House Home Access: Stairs to enter [employer] CorporationEntrance Stairs-Number of Steps: 2 Entrance Stairs-Rails: Right;Left;Can reach both Home Layout: One level         FirefighterBathroom Toilet: Standard     Home Equipment: Shower seat          Prior Functioning/Environment Level of Independence: Independent        Comments: works as a Interior and spatial designerhairdresser    OT Diagnosis: Acute pain   OT  Problem List: Pain;Impaired UE functional use   OT Treatment/Interventions:      OT Goals(Current goals can be found in the care plan section) Acute Rehab OT Goals Patient Stated Goal: go home, drive again  OT Frequency:     Barriers to D/C:            Co-evaluation              End of Session    Activity Tolerance: Patient tolerated treatment well Patient left: with call bell/phone within reach;with family/visitor present;in bed   Time: [id number] OT Time Calculation (min): 9 min Charges:  OT General Charges $OT Visit: 1 Procedure OT Evaluation $Initial OT Evaluation Tier I: 1  Procedure G-Codes: OT G-codes **NOT FOR INPATIENT CLASS** Functional Assessment Tool Used: clinical observation Functional Limitation: Self care Self Care Current Status (N8295(G8987): At least 1 percent but less than 20 percent impaired, limited or restricted Self Care Goal Status (A2130(G8988): At least 1 percent but less than 20 percent impaired, limited or restricted Self Care Discharge Status [phone](G8989): At least 1 percent but less than 20 percent impaired, limited or restricted  Pilar GrammesMathews, Amor Hyle H 01/20/2014, 11:26 AM

## 2014-05-16 ENCOUNTER — Emergency Department (HOSPITAL_COMMUNITY)
Admission: EM | Admit: 2014-05-16 | Discharge: 2014-05-17 | Disposition: A | Discharge status: Home / Self Care | Payer: Self-pay | Attending: Emergency Medicine | Admitting: Emergency Medicine

## 2014-05-16 ENCOUNTER — Encounter (HOSPITAL_COMMUNITY): Payer: Self-pay | Admitting: Emergency Medicine

## 2014-05-16 DIAGNOSIS — Z79899 Other long term (current) drug therapy: Secondary | ICD-10-CM | POA: Insufficient documentation

## 2014-05-16 DIAGNOSIS — R000 Tachycardia, unspecified: Secondary | ICD-10-CM | POA: Insufficient documentation

## 2014-05-16 DIAGNOSIS — Z720 Tobacco use: Secondary | ICD-10-CM | POA: Insufficient documentation

## 2014-05-16 DIAGNOSIS — F1010 Alcohol abuse, uncomplicated: Secondary | ICD-10-CM | POA: Insufficient documentation

## 2014-05-16 DIAGNOSIS — Z862 Personal history of diseases of the blood and blood-forming organs and certain disorders involving the immune mechanism: Secondary | ICD-10-CM | POA: Insufficient documentation

## 2014-05-16 DIAGNOSIS — K219 Gastro-esophageal reflux disease without esophagitis: Secondary | ICD-10-CM | POA: Insufficient documentation

## 2014-05-16 DIAGNOSIS — F419 Anxiety disorder, unspecified: Secondary | ICD-10-CM | POA: Insufficient documentation

## 2014-05-16 DIAGNOSIS — Z792 Long term (current) use of antibiotics: Secondary | ICD-10-CM | POA: Insufficient documentation

## 2014-05-16 LAB — COMPREHENSIVE METABOLIC PANEL
ALT: 40 U/L — ABNORMAL HIGH (ref 0–35)
AST: 73 U/L — ABNORMAL HIGH (ref 0–37)
Albumin: 4.2 g/dL (ref 3.5–5.2)
Alkaline Phosphatase: 108 U/L (ref 39–117)
Anion gap: 10 (ref 5–15)
BUN: 7 mg/dL (ref 6–23)
CO2: 27 mmol/L (ref 19–32)
CREATININE: 0.49 mg/dL — AB (ref 0.50–1.10)
Calcium: 8.9 mg/dL (ref 8.4–10.5)
Chloride: 101 mmol/L (ref 96–112)
GFR calc Af Amer: >90 mL/min (ref >90)
Glucose, Bld: 104 mg/dL — ABNORMAL HIGH (ref 70–99)
POTASSIUM: 3.3 mmol/L — AB (ref 3.5–5.1)
SODIUM: 138 mmol/L (ref 135–145)
TOTAL PROTEIN: 7.4 g/dL (ref 6.0–8.3)
Total Bilirubin: 0.4 mg/dL (ref 0.3–1.2)

## 2014-05-16 LAB — SALICYLATE LEVEL: Salicylate Lvl: <4 mg/dL (ref 2.8–20.0)

## 2014-05-16 LAB — RAPID URINE DRUG SCREEN, HOSP PERFORMED
Amphetamines: NOT DETECTED
BENZODIAZEPINES: NOT DETECTED
Barbiturates: NOT DETECTED
Cocaine: NOT DETECTED
Opiates: NOT DETECTED
TETRAHYDROCANNABINOL: NOT DETECTED

## 2014-05-16 LAB — CBC
HEMATOCRIT: 40.5 % (ref 36.0–46.0)
Hemoglobin: 13.7 g/dL (ref 12.0–15.0)
MCH: 36.2 pg — ABNORMAL HIGH (ref 26.0–34.0)
MCHC: 33.8 g/dL (ref 30.0–36.0)
MCV: 107.1 fL — ABNORMAL HIGH (ref 78.0–100.0)
PLATELETS: 245 10*3/uL (ref 150–400)
RBC: 3.78 MIL/uL — ABNORMAL LOW (ref 3.87–5.11)
RDW: 13.4 % (ref 11.5–15.5)
WBC: 5.4 10*3/uL (ref 4.0–10.5)

## 2014-05-16 LAB — ETHANOL: Alcohol, Ethyl (B): 7 mg/dL (ref 0–9)

## 2014-05-16 LAB — ACETAMINOPHEN LEVEL

## 2014-05-16 MED ORDER — VITAMIN B-1 100 MG PO TABS
100.0000 mg | ORAL_TABLET | Freq: Every day | ORAL | Status: DC
  Administered 2014-05-16: 100 mg via ORAL
  Filled 2014-05-16: qty 1

## 2014-05-16 MED ORDER — LORAZEPAM 1 MG PO TABS
0.0000 mg | ORAL_TABLET | Freq: Four times a day (QID) | ORAL | Status: DC
  Administered 2014-05-16: 2 mg via ORAL
  Filled 2014-05-16: qty 2

## 2014-05-16 MED ORDER — ONDANSETRON 4 MG PO TBDP
4.0000 mg | ORAL_TABLET | Freq: Once | ORAL | Status: AC
  Administered 2014-05-16: 4 mg via ORAL
  Filled 2014-05-16: qty 1
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
## 2014-05-16 NOTE — ED Provider Notes (Signed)
[facility] Accepting is Dr. Vito BackersLugo  Yochanan Eddleman T Aerith Canal, MD 05/16/14 2120

## 2014-05-16 NOTE — BH Assessment (Signed)
Sister Alvis LemmingsDawn [phone] informed of acceptance per pt request.   Clista BernhardtNancy Idabelle Mcpeters, [facility] Triage Specialist 05/16/2014 9:19 PM
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
## 2014-05-16 NOTE — ED Notes (Signed)
[facility] [employer] [facility] contacted for transport. [facility] reported it will be about 30 min before transportation would arrive.
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
## 2014-05-16 NOTE — ED Notes (Signed)
Pt brought in by her sister  Pt states she is feeling suicidal and has been for about a week  Pt states she is very depressed, tearful in triage, is bipolar and has been abusing alcohol  Pt states she drinks about a bottle of vodka per day  Last used this morning  Pt has no plan of suicide

## 2014-05-17 ENCOUNTER — Encounter (HOSPITAL_COMMUNITY): Payer: Self-pay

## 2014-05-17 ENCOUNTER — Inpatient Hospital Stay (HOSPITAL_COMMUNITY)
Admission: EM | Admit: 2014-05-17 | Discharge: 2014-05-20 | DRG: 897 | Disposition: A | Discharge status: Home / Self Care | Payer: No Typology Code available for payment source | Source: Intra-hospital | Attending: Psychiatry | Admitting: Psychiatry

## 2014-05-17 DIAGNOSIS — F1094 Alcohol use, unspecified with alcohol-induced mood disorder: Secondary | ICD-10-CM | POA: Diagnosis present

## 2014-05-17 DIAGNOSIS — F17210 Nicotine dependence, cigarettes, uncomplicated: Secondary | ICD-10-CM | POA: Diagnosis present

## 2014-05-17 DIAGNOSIS — Y900 Blood alcohol level of less than 20 mg/100 ml: Secondary | ICD-10-CM | POA: Diagnosis present

## 2014-05-17 DIAGNOSIS — K219 Gastro-esophageal reflux disease without esophagitis: Secondary | ICD-10-CM | POA: Diagnosis present

## 2014-05-17 DIAGNOSIS — F10230 Alcohol dependence with withdrawal, uncomplicated: Secondary | ICD-10-CM

## 2014-05-17 DIAGNOSIS — F1024 Alcohol dependence with alcohol-induced mood disorder: Secondary | ICD-10-CM | POA: Diagnosis present

## 2014-05-17 DIAGNOSIS — F1020 Alcohol dependence, uncomplicated: Secondary | ICD-10-CM | POA: Diagnosis present

## 2014-05-17 MED ORDER — ALUM & MAG HYDROXIDE-SIMETH 200-200-20 MG/5ML PO SUSP: 30.0000 mL | ORAL | Status: DC | PRN

## 2014-05-17 MED ORDER — ADULT MULTIVITAMIN W/MINERALS CH
1.0000 | ORAL_TABLET | Freq: Every day | ORAL | Status: DC
  Administered 2014-05-17 – 2014-05-20 (×4): 1 via ORAL
  Filled 2014-05-17 (×6): qty 1

## 2014-05-17 MED ORDER — PANTOPRAZOLE SODIUM 40 MG PO TBEC
40.0000 mg | DELAYED_RELEASE_TABLET | Freq: Every day | ORAL | Status: DC
  Administered 2014-05-17 – 2014-05-20 (×4): 40 mg via ORAL
  Filled 2014-05-17: qty 1
  Filled 2014-05-17 (×2): qty 14
  Filled 2014-05-17 (×4): qty 1

## 2014-05-17 MED ORDER — LOPERAMIDE HCL 2 MG PO CAPS: 2.0000 mg | ORAL_CAPSULE | ORAL | Status: AC | PRN

## 2014-05-17 MED ORDER — LORAZEPAM 1 MG PO TABS
1.0000 mg | ORAL_TABLET | Freq: Two times a day (BID) | ORAL | Status: AC
  Administered 2014-05-19 – 2014-05-20 (×2): 1 mg via ORAL
  Filled 2014-05-17 (×2): qty 1

## 2014-05-17 MED ORDER — TRAZODONE HCL 50 MG PO TABS
50.0000 mg | ORAL_TABLET | Freq: Every evening | ORAL | Status: DC | PRN
  Administered 2014-05-17 (×2): 50 mg via ORAL
  Filled 2014-05-17 (×5): qty 1

## 2014-05-17 MED ORDER — ONDANSETRON 4 MG PO TBDP: 4.0000 mg | ORAL_TABLET | Freq: Four times a day (QID) | ORAL | Status: AC | PRN

## 2014-05-17 MED ORDER — INFLUENZA VAC SPLIT QUAD 0.5 ML IM SUSY
0.5000 mL | PREFILLED_SYRINGE | INTRAMUSCULAR | Status: AC
  Administered 2014-05-18: 0.5 mL via INTRAMUSCULAR
  Filled 2014-05-17: qty 0.5

## 2014-05-17 MED ORDER — LORAZEPAM 1 MG PO TABS
1.0000 mg | ORAL_TABLET | Freq: Three times a day (TID) | ORAL | Status: AC
  Administered 2014-05-18 – 2014-05-19 (×3): 1 mg via ORAL
  Filled 2014-05-17 (×3): qty 1

## 2014-05-17 MED ORDER — LORAZEPAM 1 MG PO TABS: 1.0000 mg | ORAL_TABLET | Freq: Every day | ORAL | Status: DC

## 2014-05-17 MED ORDER — LORAZEPAM 1 MG PO TABS
1.0000 mg | ORAL_TABLET | Freq: Four times a day (QID) | ORAL | Status: AC | PRN
  Filled 2014-05-17: qty 1

## 2014-05-17 MED ORDER — VITAMIN B-1 100 MG PO TABS
100.0000 mg | ORAL_TABLET | Freq: Every day | ORAL | Status: DC
  Administered 2014-05-18 – 2014-05-20 (×3): 100 mg via ORAL
  Filled 2014-05-17 (×5): qty 1

## 2014-05-17 MED ORDER — IBUPROFEN 600 MG PO TABS: 600.0000 mg | ORAL_TABLET | Freq: Four times a day (QID) | ORAL | Status: DC | PRN

## 2014-05-17 MED ORDER — PNEUMOCOCCAL VAC POLYVALENT 25 MCG/0.5ML IJ INJ
0.5000 mL | INJECTION | INTRAMUSCULAR | Status: AC
Start: 2014-05-18 — End: 2014-05-18
  Administered 2014-05-18: 0.5 mL via INTRAMUSCULAR

## 2014-05-17 MED ORDER — THIAMINE HCL 100 MG/ML IJ SOLN: 100.0000 mg | Freq: Once | INTRAMUSCULAR | Status: DC

## 2014-05-17 MED ORDER — MAGNESIUM HYDROXIDE 400 MG/5ML PO SUSP: 30.0000 mL | Freq: Every day | ORAL | Status: DC | PRN

## 2014-05-17 MED ORDER — POTASSIUM CHLORIDE CRYS ER 20 MEQ PO TBCR
20.0000 meq | EXTENDED_RELEASE_TABLET | Freq: Two times a day (BID) | ORAL | Status: AC
  Administered 2014-05-17 – 2014-05-18 (×4): 20 meq via ORAL
  Filled 2014-05-17 (×4): qty 1

## 2014-05-17 MED ORDER — HYDROXYZINE HCL 25 MG PO TABS
25.0000 mg | ORAL_TABLET | Freq: Four times a day (QID) | ORAL | Status: AC | PRN
  Administered 2014-05-17: 25 mg via ORAL
  Filled 2014-05-17 (×2): qty 1

## 2014-05-17 MED ORDER — ACETAMINOPHEN 325 MG PO TABS: 650.0000 mg | ORAL_TABLET | Freq: Four times a day (QID) | ORAL | Status: DC | PRN

## 2014-05-17 MED ORDER — LORAZEPAM 1 MG PO TABS
1.0000 mg | ORAL_TABLET | Freq: Four times a day (QID) | ORAL | Status: AC
  Administered 2014-05-17 – 2014-05-18 (×6): 1 mg via ORAL
  Filled 2014-05-17 (×6): qty 1

## 2014-05-17 MED ORDER — NICOTINE 21 MG/24HR TD PT24
21.0000 mg | MEDICATED_PATCH | Freq: Every day | TRANSDERMAL | Status: DC
  Administered 2014-05-17 – 2014-05-19 (×3): 21 mg via TRANSDERMAL
  Filled 2014-05-17 (×4): qty 1
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
## 2014-05-17 NOTE — Progress Notes (Signed)
D: Pt presents anxious this morning. Pt noted to be fidgety on approach. Pt denies feeling suicidal and homicidal this morning. Pt reports withdrawal symptoms of tremors and hot flashes. Pt denies experiencing any hallucinations. Pt reports feeling depressed this morning due to her situation. Pt noted to be disheveled on approach, poor hygiene. Pt compliant with taking meds this morning.  A: Medications administered as ordered per MD. Verbal support given. Pt encouraged to attend groups. 15 minute checks performed for safety.  R: Pt receptive to treatment.
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
## 2014-05-17 NOTE — [facility] Group Notes (Signed)
[facility] LCSW Group Therapy 05/17/2014  1:15 PM Type of Therapy: Group Therapy Participation Level: Active  Participation Quality: Attentive, Sharing and Supportive  Affect: Depressed and Flat, tearful  Cognitive: Alert and Oriented  Insight: Developing/Improving and Engaged  Engagement in Therapy: Developing/Improving and Engaged  Modes of Intervention: Clarification, Confrontation, Discussion, Education, Exploration, Limit-setting, Orientation, Problem-solving, Rapport Building, Dance movement psychotherapisteality Testing, Socialization and Support  Summary of Progress/Problems: The topic for group today was emotional regulation. This group focused on both positive and negative emotion identification and allowed group members to process ways to identify feelings, regulate negative emotions, and find healthy ways to manage internal/external emotions. Group members were asked to reflect on a time when their reaction to an emotion led to a negative outcome and explored how alternative responses using emotion regulation would have benefited them. Group members were also asked to discuss a time when emotion regulation was utilized when a negative emotion was experienced. Patient reported that she would like to control her sadness and crying around others. She reports that she feels that her sister and partner are now aware of how much distress she is in since her hospitalization. She identifies them both as supportive. CSW and other group members provided emotional support and encouragement.  Samuella BruinKristin Mykira Hofmeister, MSW, [employer]CSWA Clinical Social Worker [facility] [phone]

## 2014-05-17 NOTE — Progress Notes (Signed)
Patient ID: Kayla Bullock, female   DOB: [birth year], 49 y.o.   MRN: [id number] Admission note: D:Patient is a voluntary admission in no acute distress for Etoh abuse and suicidal ideation without a plan. Pt contracted to come to staff if feeling unsafe. Pt report she drinks half medium bottle vodka daily. Pt reports started drinking heavily about a year ago. Pt report she fell and broke wrist while cleaning her ceiling fan. Pt report she broke her right wrist in oct 2015 and had not be able to go back to work. Pt goal is to quit drinking. Pt denies HI/AVH.   A: Pt admitted to unit per protocol, skin assessment and belonging search done. No skin issues noted. Consent signed by pt. Pt educated on therapeutic milieu rules. Pt was introduced to milieu by nursing staff. Fall risk safety plan explained to the patient. 15 minutes checks started for safety.  R: Pt was receptive to education. Writer offered support.

## 2014-05-17 NOTE — Tx Team (Signed)
Initial Interdisciplinary Treatment Plan   PATIENT STRESSORS: Financial difficulties Substance abuse   PATIENT STRENGTHS: Ability for insight Capable of independent living Motivation for treatment/growth   PROBLEM LIST: Problem List/Patient Goals Date to be addressed Date deferred Reason deferred Estimated date of resolution  anxiety 05/16/2014     ETOH abuse 05/16/2014     "I need help with my drinking" 05/16/2014     Suicidal ideation 05/16/2014     financial dificulties 05/16/2014                              DISCHARGE CRITERIA:  Improved stabilization in mood, thinking, and/or behavior Motivation to continue treatment in a less acute level of care Reduction of life-threatening or endangering symptoms to within safe limits Withdrawal symptoms are absent or subacute and managed without 24-hour nursing intervention  PRELIMINARY DISCHARGE PLAN: Attend 12-step recovery group Return to previous living arrangement  PATIENT/FAMIILY INVOLVEMENT: This treatment plan has been presented to and reviewed with the patient, Kayla Bullock, The patient and family have been given the opportunity to ask questions and make suggestions.  JEHU-APPIAH, Jamieon Lannen K 05/17/2014, 2:13 AM
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
## 2014-05-17 NOTE — ED Notes (Signed)
Pt transported to [facility] by [facility] transportation service for continuation of specialized care. She left in no acute distress. 

## 2014-05-17 NOTE — [facility] Group Notes (Signed)
   [facility]  05/17/2014  8:45 AM   Participation Quality: Alert, Appropriate and Oriented  Mood/Affect: Depressed and Flat  Depression Rating: 6-7  Anxiety Rating: 10  Thoughts of Suicide: Pt denies SI/HI  Will you contract for safety? Yes  Current AVH: Pt denies  Plan for Discharge/Comments: Pt attended discharge planning group and actively participated in group. CSW provided pt with today's workbook. Patient reports being hospitalized for depression and alcohol abuse. [place] And is able to return home at discharge. She has no health insurance.   Transportation Means: Pt reports access to transportation  Supports: No supports mentioned at this time  Samuella BruinKristin Derrin Currey, MSW, [employer] IncLCSWA Clinical Social Worker [facility] [phone]

## 2014-05-17 NOTE — Clinical Social Work Note (Signed)
CSW left voicemail for ADS Noni SaupeJamie Duvall to schedule assessment appointment. Awaiting return call with appointment date.  Samuella BruinKristin Liron Eissler, MSW, [employer]LCSWA Clinical Social Worker [facility] [phone]

## 2014-05-17 NOTE — Progress Notes (Signed)
Pt attended NA group this evening.  

## 2014-05-18 DIAGNOSIS — F419 Anxiety disorder, unspecified: Secondary | ICD-10-CM

## 2014-05-18 DIAGNOSIS — G4700 Insomnia, unspecified: Secondary | ICD-10-CM

## 2014-05-18 MED ORDER — TRAZODONE HCL 100 MG PO TABS
100.0000 mg | ORAL_TABLET | Freq: Every evening | ORAL | Status: DC | PRN
Start: 2014-05-18 — End: 2014-05-20
  Administered 2014-05-18 – 2014-05-19 (×4): 100 mg via ORAL
  Filled 2014-05-18: qty 1
  Filled 2014-05-18: qty 28
  Filled 2014-05-18 (×2): qty 1
  Filled 2014-05-18: qty 28
  Filled 2014-05-18 (×2): qty 1
  Filled 2014-05-18: qty 28
  Filled 2014-05-18: qty 1
  Filled 2014-05-18: qty 28
  Filled 2014-05-18: qty 1

## 2014-05-18 MED ORDER — ACAMPROSATE CALCIUM 333 MG PO TBEC
666.0000 mg | DELAYED_RELEASE_TABLET | Freq: Three times a day (TID) | ORAL | Status: DC
  Administered 2014-05-18 – 2014-05-20 (×7): 666 mg via ORAL
  Filled 2014-05-18 (×6): qty 2
  Filled 2014-05-18 (×2): qty 84
  Filled 2014-05-18: qty 2
  Filled 2014-05-18: qty 84
  Filled 2014-05-18 (×3): qty 2
  Filled 2014-05-18 (×3): qty 84

## 2014-05-18 NOTE — Progress Notes (Signed)
Patient ID: Kayla Bullock, female   DOB: Feb 02, 1966, 49 y.o.   MRN: [id number] D: client visible on the unit, in dayroom playing cards, interacts appropriately with staff and peers. Client reports depression at "5" of 10, "but my drinking was the main thing, I was drinking a fifth a day, it got real bad" [facility]  goal "to have a good day and have a BM"  "I had both" client reports "having a good day" "groups been the best thing for me and listening to the speakers, knowing that I'm not the only one like that" "he said you can beat this" A: Writer provided emotional support, encouraged group, reviewed medications, administered as ordered. R: Client is safe on the unit, attended group.

## 2014-05-18 NOTE — [facility] Suicide Risk Assessment (Signed)
[facility] INPATIENT:  Family/Significant Other Suicide Prevention Education  Suicide Prevention Education:  Contact Attempts: Kayla Bullock (pt's partner) [phone] has been identified by the patient as the family member/significant other with whom the patient will be residing, and identified as the person(s) who will aid the patient in the event of a mental health crisis.  With written consent from the patient, two attempts were made to provide suicide prevention education, prior to and/or following the patient's discharge.  We were unsuccessful in providing suicide prevention education.  A suicide education pamphlet was given to the patient to share with family/significant other.  Date and time of first attempt: 05/18/14 (voicemail left requesting call back at earliest convenience).   Smart, Graclyn Lawther LCSWA  05/18/2014, 3:38 PM   05/18/14 05/18/2014 4:11 PM   SPE completed with Clydie BraunKaren. She was informed of after care plan and additional support resources for pt and for family (NAMI/MHA-Family and Friends Group Support, Al-Anon). She has no concerns regarding pt returning home at d/c or regarding pt's likelihood of SI.  The Sherwin-WilliamsHeather Smart, LCSWA 05/18/2014. 4:13 PM

## 2014-05-18 NOTE — Tx Team (Signed)
Interdisciplinary Treatment Plan Update (Adult)   Date: 05/18/2014  Time Reviewed:9:43 AM  Progress in Treatment:  Attending groups: Yes  Participating in groups:  Yes  Taking medication as prescribed: Yes  Tolerating medication: Yes  Family/Significant othe contact made: Not yet. SPE required for this pt.   Patient understands diagnosis: Yes, AEB seeking treatment for ETOH detox, depression, mood stabilization, and for med management.  Discussing patient identified problems/goals with staff: Yes  Medical problems stabilized or resolved: Yes  Denies suicidal/homicidal ideation: Yes during self report.  Patient has not harmed self or Others: Yes  New problem(s) identified:  Discharge Plan or Barriers: Pt plans to attend ADS for med management and for assessment for SA IOP. Additional comments: 49 Y/O female who states that things started to get out of control, Started to get "violent with her drinking". States it is affecting her job. Has gotten worst since October. She fell and broke her right wrist. 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 States because she was not working her drinking escalted. Drinks about a fith a day or if not a 1.5 bottle of wine. States she would drink until she passes out but she is sure she has enough for the next day to curb the withdrawal. Before October she made sure that the bottle lasted two days. In April she states she realized she was getting "a little insane." States she was able to quit for 60 days. Sates when she went to AA she left wanting to drink. Started drinking since late teens, got serious drinking 15 years ago when her mother died of lung cancer.  Reason for Continuation of Hospitalization: ETOH detox Mood stabilization Med management Estimated length of stay: 3-5 days  For review of initial/current patient goals, please see plan of care.  Attendees:  Patient:    Family:    Physician: Geoffery LyonsIrving Lugo MD 05/18/2014 9:44 AM   Nursing:  Vassie LollVivian, Beverly, Marian RN 05/18/2014 9:45 AM   Clinical Social Worker Gagandeep Kossman Smart, LCSWA  05/18/2014 9:45 AM   Other: Gerilyn PilgrimQuylle H. LCSWSanda Klein; Kristin D. LCSW 05/18/2014 9:45 AM   Other: Darden DatesJennifer C. Nurse CM 05/18/2014 9:45 AM   Other: Vikki PortsValerie; [facility] 05/18/2014 9:45 AM   Other:    Scribe for Treatment Team:  The Sherwin-WilliamsHeather Smart LCSWA 05/18/2014 9:45 AM

## 2014-05-18 NOTE — Progress Notes (Signed)
D:  Patient's self inventory sheet, patient slept fair to poor, sleep medication was helpful.  Fair appetite, normal energy level, poor concentration.  Rated depression 6, hopeless 3, anxiety 7.  Withdrawals of chilling, cravings.  Denied SI.  Physical problems coughing.  Denied physical pain.  Goal to have movement.  Plans to pray.  Does have discharge plans.  No problems anticipated after discharge. A:  Medications administered per MD orders.  Emotional support and encouragement given patient. R:  Denied SI and HI.   Denied A/V hallucinations.  Safety maintained with 15 minute checks. Patient talked on phone this morning.  Patient crying while on phone and after call.

## 2014-05-18 NOTE — [facility] Group Notes (Signed)
The focus of this group is to educate the patient on the purpose and policies of crisis stabilization and provide a format to answer questions about their admission.  The group details unit policies and expectations of patients while admitted.  Patient attended 0900 nurse education orientation group this morning.  Patient actively participated, appropriate affect, alert, appropriate insight and engagement.  Today patient will work on 3 goals for discharge.  

## 2014-05-18 NOTE — Progress Notes (Signed)
D. Pt had been up and visible in milieu this evening, did attend and participate in evening group activity. Pt did endorse anxiety and did appear anxious and nervous while in conversation and appears to be experiencing some signs and symptoms of withdrawal. Pt did speak about the need for sobriety this evening, and did receive medications without incident. A. Support and encouragement provided. R. Safety maintained, will continue to monitor.

## 2014-05-18 NOTE — Progress Notes (Signed)
[facility] MD Progress Note  05/18/2014 3:54 PM Kayla Bullock  MRN:  [id number] Subjective:  Kayla Bullock does not feel too well. She states that she is having alcohol cravings has dreams where she sees herself drinking. She admits feeling anxious, worried. She states she cant stop worrying. She knows her partner is supportive of her and that she does not drink. 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 She is worried of being out Super Bowl Sunday as that is a big drinking day for her Principal Problem: <principal problem not specified> Diagnosis:   Patient Active Problem List   Diagnosis Date Noted  . 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 Alcohol dependence [F10.20] 05/17/2014  . Radius distal fracture [S52.509A] 01/19/2014  . HYPERLIPIDEMIA [E78.5] 12/23/2006  . SMOKER [Z72.0] 12/23/2006  . 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 GERD (gastroesophageal reflux disease)   . Iron deficiency anemia     Past Surgical History  Procedure Laterality Date  . Orif wrist fracture Right 01/19/2014    DVR plates  . Fracture surgery    . Orif wrist fracture Right 01/19/2014    Procedure: OPEN REDUCTION INTERNAL FIXATION (ORIF) WRIST FRACTURE;  Surgeon: Roseanne Kaufman, MD;  Location: [place];  Service: Orthopedics;  Laterality: Right;  DVR plates   Family History:  Family History  Problem Relation Age of Onset  . Cancer Other   . Diabetes Other   . Hypertension Other   . Stroke Other   . CAD Other    Social History:  History  Alcohol Use  . Yes    Comment: 1 bottle of vodka per day      History  Drug Use No    History   Social History  . Marital Status: Married    Spouse Name: N/A    Number of  Children: N/A  . Years of Education: N/A   Social History Main Topics  . Smoking status: Current Every Day Smoker -- 0.33 packs/day for 20 years    Types: Cigarettes  . Smokeless tobacco: Never Used  . Alcohol Use: Yes     Comment: 1 bottle of vodka per day   . Drug Use: No  . Sexual Activity: Yes   Other Topics Concern  . None   Social History Narrative   Additional History:    Sleep: Poor  Appetite:  Poor   Assessment:   Musculoskeletal: Strength & Muscle Tone: within normal limits Gait & Station: normal Patient leans: N/A   Psychiatric Specialty Exam: Physical Exam  Review of Systems  Constitutional: Positive for malaise/fatigue.  HENT: Negative.   Eyes: Negative.   Respiratory: Negative.   Cardiovascular: Negative.   Gastrointestinal: Negative.   Genitourinary: Negative.   Musculoskeletal: Negative.   Skin: Negative.   Neurological: Positive for weakness.  Endo/Heme/Allergies: Negative.   Psychiatric/Behavioral: Positive for depression and substance abuse. The patient is nervous/anxious.     Blood pressure 120/87, pulse 98, temperature 97.9 F (36.6 C), temperature source Oral, resp. rate 20, height 5' 8.5" (1.74 m), weight 61.236 kg (135 lb).Body mass index is 20.23 kg/(m^2).  General Appearance: Fairly Groomed  Engineer, water::  Fair  Speech:  Clear and Coherent  Volume:  fluctuates  Mood:  Anxious and worried  Affect:  anxious worried  Thought Process:  Coherent and Goal Directed  Orientation:  Full (Time, Place, and Person)  Thought Content:  symptoms events worries concerns  Suicidal Thoughts:  No  Homicidal Thoughts:  No  Memory:  Immediate;   Fair Recent;   Fair Remote;   Fair  Judgement:  Fair  Insight:  Present  Psychomotor Activity:  Restlessness  Concentration:  Fair  Recall:  [employer] of Knowledge:Fair  Language: Fair  Akathisia:  No  Handed:  Right  AIMS (if indicated):     Assets:  Desire for Improvement Housing Social  Support Talents/Skills  ADL's:  Intact  Cognition: WNL  Sleep:  Number of Hours: 6     Current Medications: Current Facility-Administered Medications  Medication Dose Route Frequency Provider Last Rate Last Dose  . acamprosate (CAMPRAL) tablet 666 mg  666 mg Oral TID WC Nicholaus Bloom, MD   666 mg at 05/18/14 1151  . acetaminophen (TYLENOL) tablet 650 mg  650 mg Oral Q6H PRN Laverle Hobby, PA-C      . alum & mag hydroxide-simeth (MAALOX/MYLANTA) 200-200-20 MG/5ML suspension 30 mL  30 mL Oral Q4H PRN Laverle Hobby, PA-C      . hydrOXYzine (ATARAX/VISTARIL) tablet 25 mg  25 mg Oral Q6H PRN Laverle Hobby, PA-C   25 mg at 05/17/14 0142  . ibuprofen (ADVIL,MOTRIN) tablet 600 mg  600 mg Oral Q6H PRN Laverle Hobby, PA-C      . loperamide (IMODIUM) capsule 2-4 mg  2-4 mg Oral PRN Laverle Hobby, PA-C      . LORazepam (ATIVAN) tablet 1 mg  1 mg Oral Q6H PRN Laverle Hobby, PA-C      . LORazepam (ATIVAN) tablet 1 mg  1 mg Oral TID Laverle Hobby, PA-C       Followed by  . [START ON 05/19/2014] LORazepam (ATIVAN) tablet 1 mg  1 mg Oral BID Laverle Hobby, PA-C       Followed by  . [START ON 05/21/2014] LORazepam (ATIVAN) tablet 1 mg  1 mg Oral Daily Spencer E Simon, PA-C      . magnesium hydroxide (MILK OF MAGNESIA) suspension 30 mL  30 mL Oral Daily PRN Laverle Hobby, PA-C      . multivitamin with minerals tablet 1 tablet  1 tablet Oral Daily Laverle Hobby, PA-C   1 tablet at 05/18/14 8413  . nicotine (NICODERM CQ - dosed in mg/24 hours) patch 21 mg  21 mg Transdermal Daily Nicholaus Bloom, MD   21 mg at 05/18/14 0810  . ondansetron (ZOFRAN-ODT) disintegrating tablet 4 mg  4 mg Oral Q6H PRN Laverle Hobby, PA-C      . pantoprazole (PROTONIX) EC tablet 40 mg  40 mg Oral Daily Laverle Hobby, PA-C   40 mg at 05/18/14 0806  . potassium chloride SA (K-DUR,KLOR-CON) CR tablet 20 mEq  20 mEq Oral BID Laverle Hobby, PA-C   20 mEq at 05/18/14 2440  . thiamine (B-1) injection 100 mg  100 mg  Intramuscular Once Laverle Hobby, PA-C   100 mg at 05/17/14 0115  . thiamine (VITAMIN B-1) tablet 100 mg  100 mg Oral Daily Laverle Hobby, PA-C   100 mg at 05/18/14 1027  . traZODone (DESYREL) tablet 100 mg  100 mg Oral QHS,MR X 1 Nicholaus Bloom, MD  Lab Results:  Results for orders placed or performed during the hospital encounter of 05/16/14 (from the past 48 hour(s))  Acetaminophen level     Status: Abnormal   Collection Time: 05/16/14  7:45 PM  Result Value Ref Range   Acetaminophen (Tylenol), Serum <10.0 (L) 10 - 30 ug/mL    Comment:        THERAPEUTIC CONCENTRATIONS VARY SIGNIFICANTLY. A RANGE OF 10-30 ug/mL MAY BE AN EFFECTIVE CONCENTRATION FOR MANY PATIENTS. HOWEVER, SOME ARE BEST TREATED AT CONCENTRATIONS OUTSIDE THIS RANGE. ACETAMINOPHEN CONCENTRATIONS >150 ug/mL AT 4 HOURS AFTER INGESTION AND >50 ug/mL AT 12 HOURS AFTER INGESTION ARE OFTEN ASSOCIATED WITH TOXIC REACTIONS.   Ethanol (ETOH)     Status: None   Collection Time: 05/16/14  7:45 PM  Result Value Ref Range   Alcohol, Ethyl (B) 7 0 - 9 mg/dL    Comment:        LOWEST DETECTABLE LIMIT FOR SERUM ALCOHOL IS 11 mg/dL FOR MEDICAL PURPOSES ONLY   Salicylate level     Status: None   Collection Time: 05/16/14  7:45 PM  Result Value Ref Range   Salicylate Lvl <5.7 2.8 - 20.0 mg/dL  Urine Drug Screen     Status: None   Collection Time: 05/16/14  7:48 PM  Result Value Ref Range   Opiates NONE DETECTED NONE DETECTED   Cocaine NONE DETECTED NONE DETECTED   Benzodiazepines NONE DETECTED NONE DETECTED   Amphetamines NONE DETECTED NONE DETECTED   Tetrahydrocannabinol NONE DETECTED NONE DETECTED   Barbiturates NONE DETECTED NONE DETECTED    Comment:        DRUG SCREEN FOR MEDICAL PURPOSES ONLY.  IF CONFIRMATION IS NEEDED FOR ANY PURPOSE, NOTIFY LAB WITHIN 5 DAYS.        LOWEST DETECTABLE LIMITS FOR URINE DRUG SCREEN Drug Class       Cutoff (ng/mL) Amphetamine      1000 Barbiturate       200 Benzodiazepine   846 Tricyclics       962 Opiates          300 Cocaine          300 THC              50   CBC     Status: Abnormal   Collection Time: 05/16/14  7:49 PM  Result Value Ref Range   WBC 5.4 4.0 - 10.5 K/uL   RBC 3.78 (L) 3.87 - 5.11 MIL/uL   Hemoglobin 13.7 12.0 - 15.0 g/dL   HCT 40.5 36.0 - 46.0 %   MCV 107.1 (H) 78.0 - 100.0 fL   MCH 36.2 (H) 26.0 - 34.0 pg   MCHC 33.8 30.0 - 36.0 g/dL   RDW 13.4 11.5 - 15.5 %   Platelets 245 150 - 400 K/uL  Comprehensive metabolic panel     Status: Abnormal   Collection Time: 05/16/14  7:49 PM  Result Value Ref Range   Sodium 138 135 - 145 mmol/L   Potassium 3.3 (L) 3.5 - 5.1 mmol/L   Chloride 101 96 - 112 mmol/L   CO2 27 19 - 32 mmol/L   Glucose, Bld 104 (H) 70 - 99 mg/dL   BUN 7 6 - 23 mg/dL   Creatinine, Ser 0.49 (L) 0.50 - 1.10 mg/dL   Calcium 8.9 8.4 - 10.5 mg/dL   Total Protein 7.4 6.0 - 8.3 g/dL   Albumin 4.2 3.5 - 5.2 g/dL   AST 73 (H) 0 - 37  U/L   ALT 40 (H) 0 - 35 U/L   Alkaline Phosphatase 108 39 - 117 U/L   Total Bilirubin 0.4 0.3 - 1.2 mg/dL   GFR calc non Af Amer >90 >90 mL/min   GFR calc Af Amer >90 >90 mL/min    Comment: (NOTE) The eGFR has been calculated using the CKD EPI equation. This calculation has not been validated in all clinical situations. eGFR's persistently <90 mL/min signify possible Chronic Kidney Disease.    Anion gap 10 5 - 15    Physical Findings: AIMS: Facial and Oral Movements Muscles of Facial Expression: None, normal Lips and Perioral Area: None, normal Jaw: None, normal Tongue: None, normal,Extremity Movements Upper (arms, wrists, hands, fingers): None, normal Lower (legs, knees, ankles, toes): None, normal, Trunk Movements Neck, shoulders, hips: None, normal, Overall Severity Severity of abnormal movements (highest score from questions above): None, normal Incapacitation due to abnormal movements: None, normal Patient's awareness of abnormal movements (rate only  patient's report): No Awareness, Dental Status Current problems with teeth and/or dentures?: No Does patient usually wear dentures?: No  CIWA:  CIWA-Ar Total: 5 COWS:  COWS Total Score: 7  Treatment Plan Summary: Daily contact with patient to assess and evaluate symptoms and progress in treatment and Medication management Supportive approach/coping skills/relapse prevention Alcohol Dependence; continue Ativan Detox protocol Alcohol Cravings: will start Campral 333 mg two TID Insomnia: will increase the Trazodone to 100 mg HS PRN sleep Anxiety/worry: CBT, mindfulness   Medical Decision Making:  Established Problem, Stable/Improving (1), Review of Medication Regimen & Side Effects (2) and Review of New Medication or Change in Dosage (2)     Tylen Leverich A 05/18/2014, 3:54 PM

## 2014-05-18 NOTE — Progress Notes (Signed)
Adult Psychoeducational Group Note  Date:  05/18/2014 Time:  10:54 PM  Group Topic/Focus:  Wrap-Up Group:   The focus of this group is to help patients review their daily goal of treatment and discuss progress on daily workbooks.  Participation Level:  Active  Participation Quality:  Appropriate  Affect:  Appropriate  Cognitive:  Appropriate  Insight: Appropriate  Engagement in Group:  Engaged  Modes of Intervention:  Discussion  Additional Comments:    Kayla Bullock A 05/18/2014, 10:54 PM 

## 2014-05-19 LAB — COMPREHENSIVE METABOLIC PANEL
ALK PHOS: 109 U/L (ref 39–117)
ALT: 34 U/L (ref 0–35)
AST: 46 U/L — AB (ref 0–37)
Albumin: 4.2 g/dL (ref 3.5–5.2)
Anion gap: 8 (ref 5–15)
BUN: 9 mg/dL (ref 6–23)
CALCIUM: 9 mg/dL (ref 8.4–10.5)
CHLORIDE: 102 mmol/L (ref 96–112)
CO2: 27 mmol/L (ref 19–32)
Creatinine, Ser: 0.59 mg/dL (ref 0.50–1.10)
GFR calc Af Amer: >90 mL/min (ref >90)
GFR calc non Af Amer: >90 mL/min (ref >90)
GLUCOSE: 102 mg/dL — AB (ref 70–99)
Potassium: 3.6 mmol/L (ref 3.5–5.1)
Sodium: 137 mmol/L (ref 135–145)
TOTAL PROTEIN: 7.4 g/dL (ref 6.0–8.3)
Total Bilirubin: 0.6 mg/dL (ref 0.3–1.2)

## 2014-05-19 MED ORDER — NICOTINE POLACRILEX 2 MG MT GUM
2.0000 mg | CHEWING_GUM | OROMUCOSAL | Status: DC | PRN
  Filled 2014-05-19: qty 1

## 2014-05-19 NOTE — Progress Notes (Signed)
[facility]  05/19/2014 2:30 PM Kayla Bullock  MRN:  [id number] Subjective:  Kayla Bullock continues to be detox. She is still having some tachycardia. She has not been able to sleep too well in the last couple of nights. States she has had a lot of nightmares. Questions if the nicotine patch could be a culprit. She is anticipating being D/C tomorrow. She has identified Superbowl Sunday as being a big trigger for her relapse. She is going to be picked up by a family member and taken to her aunt's house. Her aunt's house is a safe place for her. She is still dealing with some anxiety but she feels is situational. She is committed to abstinence. She is anticipating going back to work next week Principal Problem: <principal problem not specified> Diagnosis:   Patient Active Problem List   Diagnosis Date Noted  . 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 Alcohol dependence [F10.20] 05/17/2014  . Radius distal fracture [S52.509A] 01/19/2014  . HYPERLIPIDEMIA [E78.5] 12/23/2006  . SMOKER [Z72.0] 12/23/2006  . 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 GERD (gastroesophageal reflux disease)   . Iron deficiency anemia     Past Surgical History  Procedure Laterality Date  . Orif wrist fracture Right 01/19/2014    DVR plates  . Fracture surgery    . Orif wrist fracture Right 01/19/2014    Procedure: OPEN REDUCTION INTERNAL FIXATION (ORIF) WRIST FRACTURE;  Surgeon: Dominica SeverinWilliam Gramig, MD;  Location: [facility] OR;  Service: Orthopedics;  Laterality: Right;  DVR plates   Family History:  Family History  Problem Relation Age of Onset  . Cancer Other   . Diabetes Other   . Hypertension Other   . Stroke Other   . CAD Other    Social History:  History  Alcohol Use  . Yes    Comment: 1 bottle of vodka per day      History  Drug Use No    History   Social History  .  Marital Status: Married    Spouse Name: N/A    Number of Children: N/A  . Years of Education: N/A   Social History Main Topics  . Smoking status: Current Every Day Smoker -- 0.33 packs/day for 20 years    Types: Cigarettes  . Smokeless tobacco: Never Used  . Alcohol Use: Yes     Comment: 1 bottle of vodka per day   . Drug Use: No  . Sexual Activity: Yes   Other Topics Concern  . None   Social History Narrative   Additional History:    Sleep: Poor  Appetite:  Fair   Assessment:   Musculoskeletal: Strength & Muscle Tone: within normal limits Gait & Station: normal Patient leans: N/A   Psychiatric Specialty Exam: Physical Exam  Review of Systems  Constitutional: Negative.   HENT: Negative.   Eyes: Negative.   Respiratory: Negative.   Cardiovascular: Negative.   Gastrointestinal: Negative.   Genitourinary: Negative.   Musculoskeletal: Negative.   Skin: Negative.   Neurological: Negative.   Endo/Heme/Allergies: Negative.   Psychiatric/Behavioral: Positive for substance abuse. The patient is nervous/anxious and has insomnia.     Blood pressure 108/91, pulse 121, temperature 97.8 F (36.6 C), temperature source Oral, resp. rate 18, height 5' 8.5" (1.74 m), weight 61.236 kg (135 lb).Body mass index is 20.23 kg/(m^2).  General Appearance: Fairly Groomed  Eye  Contact::  Fair  Speech:  Clear and Coherent  Volume:  Normal  Mood:  Anxious and worried  Affect:  anxious worried  Thought Process:  Coherent and Goal Directed  Orientation:  Full (Time, Place, and Person)  Thought Content:  symptoms events worries concerns  Suicidal Thoughts:  No  Homicidal Thoughts:  No  Memory:  Immediate;   Fair Recent;   Fair Remote;   Fair  Judgement:  Fair  Insight:  Present and Shallow  Psychomotor Activity:  Restlessness  Concentration:  Fair  Recall:  [employer] of Knowledge:Fair  Language: Fair  Akathisia:  No  Handed:  Right  AIMS (if indicated):     Assets:  Desire  for Improvement Housing Social Support Vocational/Educational  ADL's:  Intact  Cognition: WNL  Sleep:  Number of Hours: 5.75     Current Medications: Current Facility-Administered Medications  Medication Dose Route Frequency Provider Last Rate Last Dose  . acamprosate (CAMPRAL) tablet 666 mg  666 mg Oral TID WC Rachael Fee, MD   666 mg at 05/19/14 1148  . acetaminophen (TYLENOL) tablet 650 mg  650 mg Oral Q6H PRN Kerry Hough, PA-C      . alum & mag hydroxide-simeth (MAALOX/MYLANTA) 200-200-20 MG/5ML suspension 30 mL  30 mL Oral Q4H PRN Kerry Hough, PA-C      . hydrOXYzine (ATARAX/VISTARIL) tablet 25 mg  25 mg Oral Q6H PRN Kerry Hough, PA-C   25 mg at 05/17/14 0142  . ibuprofen (ADVIL,MOTRIN) tablet 600 mg  600 mg Oral Q6H PRN Kerry Hough, PA-C      . loperamide (IMODIUM) capsule 2-4 mg  2-4 mg Oral PRN Kerry Hough, PA-C      . LORazepam (ATIVAN) tablet 1 mg  1 mg Oral Q6H PRN Kerry Hough, PA-C      . LORazepam (ATIVAN) tablet 1 mg  1 mg Oral BID Kerry Hough, PA-C       Followed by  . [START ON 05/21/2014] LORazepam (ATIVAN) tablet 1 mg  1 mg Oral Daily Spencer E Simon, PA-C      . magnesium hydroxide (MILK OF MAGNESIA) suspension 30 mL  30 mL Oral Daily PRN Kerry Hough, PA-C      . multivitamin with minerals tablet 1 tablet  1 tablet Oral Daily Kerry Hough, PA-C   1 tablet at 05/19/14 [phone]  . nicotine polacrilex (NICORETTE) gum 2 mg  2 mg Oral PRN Rachael Fee, MD      . ondansetron (ZOFRAN-ODT) disintegrating tablet 4 mg  4 mg Oral Q6H PRN Kerry Hough, PA-C      . pantoprazole (PROTONIX) EC tablet 40 mg  40 mg Oral Daily Kerry Hough, PA-C   40 mg at 05/19/14 9604  . thiamine (B-1) injection 100 mg  100 mg Intramuscular Once Kerry Hough, PA-C   100 mg at 05/17/14 0115  . thiamine (VITAMIN B-1) tablet 100 mg  100 mg Oral Daily Kerry Hough, PA-C   100 mg at 05/19/14 5409  . traZODone (DESYREL) tablet 100 mg  100 mg Oral QHS,MR X 1  Rachael Fee, MD   100 mg at 05/18/14 2314    Lab Results: No results found for this or any previous visit (from the past 48 hour(s)).  Physical Findings: AIMS: Facial and Oral Movements Muscles of Facial Expression: None, normal Lips and Perioral Area: None, normal Jaw: None, normal Tongue: None, normal,Extremity Movements  Upper (arms, wrists, hands, fingers): None, normal Lower (legs, knees, ankles, toes): None, normal, Trunk Movements Neck, shoulders, hips: None, normal, Overall Severity Severity of abnormal movements (highest score from questions above): None, normal Incapacitation due to abnormal movements: None, normal Patient's awareness of abnormal movements (rate only patient's report): No Awareness, Dental Status Current problems with teeth and/or dentures?: No Does patient usually wear dentures?: No  CIWA:  CIWA-Ar Total: 1 COWS:  COWS Total Score: 7  Treatment Plan Summary: Daily contact with patient to assess and evaluate symptoms and progress in treatment and Medication management Supportive approach/cioping skills/relapse prevention Alcohol Dependence: complete the detox, continue the Campral for the cravings and work on the relapse prevention plan as she faces Super Bowl Sunday Nightmares: D/C the nicotine patch, use the Nicorette gum Medical Decision Making:  Review of Psycho-Social Stressors (1), Review of Medication Regimen & Side Effects (2) and Review of New Medication or Change in Dosage (2)     Aylen Rambert A 05/19/2014, 2:30 PM

## 2014-05-19 NOTE — [facility] Group Notes (Signed)
[facility]   05/19/2014 11:18 AM  Participation Quality:  Appropriate   Mood/Affect:  Appropriate  Depression Rating:  0  Anxiety Rating:  5-6  Thoughts of Suicide:  No Will you contract for safety?   NA  Current AVH:  No  Plan for Discharge/Comments: Pt reports that she slept poorly last night but is feeling okay today. Pt ready to d/c Sat--"My aunt will spend the weekend with me so I feel safe to go home." Pt to follow-up at [facility] in the meantime. Meds working well according to pt. Mild tremors today/minimal withdrawals.   Transportation Means: partner/family   Supports: Writeraunt/partner, family   Smart, OncologistHeather LCSWA

## 2014-05-19 NOTE — Progress Notes (Signed)
The patient attended this evening's A.A. Meeting and was appropriate. When asked to introduce herself in group, she admit to being alcoholic and is seeking further treatment after being discharged.

## 2014-05-19 NOTE — Progress Notes (Signed)
D) Pt has been attending the groups and interacting with her peers appropriately. Affect is appropriate. Pt denies SI and HI. Pt rates her depression at a 6 her hopelessness at a 6 and her anxiety at a 9. Has attended the program. Pt is preparing for discharge tomorrow. States that she is feeling better and is excited about her medication that will help her to stop drinking A) Given support, reassurance and praise. Provided with a 1:1. Encouragement given, along with praise. R) Denies SI and HI.

## 2014-05-19 NOTE — B[facility] Group Notes (Signed)
[facility] LCSW Group Therapy  05/19/2014 3:09 PM  Type of Therapy:  Group Therapy  Participation Level:  Active  Participation Quality:  Attentive  Affect:  Appropriate  Cognitive:  Alert and Oriented  Insight:  Engaged  Engagement in Therapy:  Engaged  Modes of Intervention:  Confrontation, Discussion, Education, Exploration, Problem-solving, Rapport Building, Socialization and Support  Summary of Progress/Problems: Feelings around Relapse. Group members discussed the meaning of relapse and shared personal stories of relapse, how it affected them and others, and how they perceived themselves during this time. Group members were encouraged to identify triggers, warning signs and coping skills used when facing the possibility of relapse. Social supports were discussed and explored in detail. Kayla Bullock was attentive and engaged during today's processing group. She shared that from this expereince, she learned that she must either overcome her resistance to AA or find alternative support in the community. She shared her past negative experiences with AA groups and people within AA. Kayla Bullock then opened up about her hopes regarding SA IOP at [facility] and possibly individual therapy. "My family and partner are excellent supports also. I just have to reach out to them." Kayla Bullock continues to demonstrate progress in the group setting and stated that having a plan on superbowl Sunday to help her avoid relapse has eased her anxiety about d/cing from the hospital significantly.    Smart, Krystelle Prashad LCSWA  05/19/2014, 3:09 PM

## 2014-05-19 NOTE — Plan of Care (Signed)
Problem: Diagnosis: Increased Risk For Suicide Attempt Goal: LTG-Patient Will Report Improved Mood and Deny Suicidal LTG (by discharge) Patient will report improved mood and deny suicidal ideation.  Outcome: Progressing Client denies SHI, "drinking was my main thing"

## 2014-05-19 NOTE — Progress Notes (Signed)
  [facility] :  Will you be returning to the same living situation after discharge:  Yes,  home At discharge, do you have transportation home?: Yes,  aunt or partner Do you have the ability to pay for your medications: Yes,  mental health  Release of information consent forms completed and submitted to Medical Records by CSW.  Patient to Follow up at: Follow-up Information    [facility].   Why:  Appt for assessment (med management and SA IOP) on this date at 10AM. Annice PihJackie will likely callyou to move this appt date up after your discharge so that you can be seen sooner. If you have any questions, please call Annice PihJackie at [phone]   Contact information:   [address],  [place], [place] [address] [phone] Fax: [phone]      Follow up with [facility].   Why:  Walk in between 8am-9am Monday through Friday for hospital follow-up/medication management IF NEEDED prior to your assessment at [facility].    Contact information:   [address]  [place], [place] [address] Phone: [phone] Fax: [phone]      Patient denies SI/HI: Yes,  during group/self report.    Safety Planning and Suicide Prevention discussed: Yes,  SPE completed with pt's partner. SPI pamphlet provided to pt and she was encouraged to share information with support network, ask questions, and talk about any concerns relating to SPE.  Has patient been referred to the Quitline?: Yes, faxed on 05/19/14  SmartHerbert Seta, Louis Gaw LCSWA  05/19/2014, 11:25 AM

## 2014-05-19 NOTE — Progress Notes (Signed)
D.  Pt pleasant on approach, denies complaints at this time.  Positive for evening AA group, interacting appropriately with peers on the unit.  Denies SI/HI/hallucinations at this time.  A.  Support and encouragement offered  R.  Pt remains safe on unit, will continue to monitor.  

## 2014-05-19 NOTE — Plan of Care (Signed)
Problem: Alteration in mood & ability to function due to Goal: STG-Patient will attend groups Outcome: Progressing Client attends groups, reports "I enjoy the groups, the speakers, just knowing I'm not the only one like that"

## 2014-05-20 DIAGNOSIS — F1910 Other psychoactive substance abuse, uncomplicated: Secondary | ICD-10-CM

## 2014-05-20 DIAGNOSIS — F1094 Alcohol use, unspecified with alcohol-induced mood disorder: Secondary | ICD-10-CM

## 2014-05-20 MED ORDER — ACAMPROSATE CALCIUM 333 MG PO TBEC
666.0000 mg | DELAYED_RELEASE_TABLET | Freq: Three times a day (TID) | ORAL | Status: DC
Start: 2014-05-20 — End: 2021-04-10

## 2014-05-20 MED ORDER — PANTOPRAZOLE SODIUM 40 MG PO TBEC: 40.0000 mg | DELAYED_RELEASE_TABLET | Freq: Every day | ORAL | Status: DC

## 2014-05-20 MED ORDER — HYDROXYZINE HCL 25 MG PO TABS: 25.0000 mg | ORAL_TABLET | Freq: Four times a day (QID) | ORAL | Status: AC | PRN

## 2014-05-20 MED ORDER — TRAZODONE HCL 100 MG PO TABS: 100.0000 mg | ORAL_TABLET | Freq: Every evening | ORAL | Status: DC | PRN

## 2014-05-20 MED ORDER — NICOTINE POLACRILEX 2 MG MT GUM: 2.0000 mg | CHEWING_GUM | OROMUCOSAL | Status: AC | PRN

## 2014-05-20 NOTE — B[facility] Suicide Risk Assessment (Signed)
[facility] Discharge Suicide Risk Assessment   Demographic Factors:  middle age caucasian female  Total Time spent with patient: 45 minutes  Musculoskeletal: Strength & Muscle Tone: within normal limits Gait & Station: normal Patient leans: N/A  Psychiatric Specialty Exam: Physical Exam  Constitutional: She appears well-developed and well-nourished. No distress.  Skin: She is not diaphoretic.    Review of Systems  Constitutional: Negative.   Cardiovascular: Negative for chest pain.  Neurological: Negative for tremors and headaches.  Psychiatric/Behavioral: Negative for depression.    Blood pressure 93/76, pulse 111, temperature 98.6 F (37 C), temperature source Oral, resp. rate 18, height 5' 8.5" (1.74 m), weight 61.236 kg (135 lb).Body mass index is 20.23 kg/(m^2).  General Appearance: Casual  Eye Contact::  Fair  Speech:  Slow409  Volume:  Normal  Mood:  Euthymic  Affect:  Congruent  Thought Process:  Coherent and Intact  Orientation:  Full (Time, Place, and Person)  Thought Content:  Rumination  Suicidal Thoughts:  No  Homicidal Thoughts:  No  Memory:  Immediate;   Fair Recent;   Fair  Judgement:  Fair  Insight:  Shallow  Psychomotor Activity:  Normal  Concentration:  Fair  Recall:  [employer] of Knowledge:Fair  Language: Fair  Akathisia:  Negative  Handed:  Right  AIMS (if indicated):     Assets:  Communication Skills Desire for Improvement Financial Resources/Insurance  Sleep:  Number of Hours: 6.25  Cognition: WNL  ADL's:  Intact   Have you used any form of tobacco in the last 30 days? (Cigarettes, Smokeless Tobacco, Cigars, and/or Pipes): Yes  Has this patient used any form of tobacco in the last 30 days? (Cigarettes, Smokeless Tobacco, Cigars, and/or Pipes) Yes, Prescription not provided because: counselling given  Mental Status Per Nursing Assessment::   On Admission:  Self-harm thoughts, Suicidal ideation indicated by patient  Current Mental Status by  Physician: see above  Loss Factors: Decrease in vocational status and Financial problems/change in socioeconomic status  Historical Factors: Impulsivity  Risk Reduction Factors:   Positive social support and Positive therapeutic relationship  Continued Clinical Symptoms:  Alcohol/Substance Abuse/Dependencies More than one psychiatric diagnosis Unstable or Poor Therapeutic Relationship  Cognitive Features That Contribute To Risk:  Closed-mindedness    Suicide Risk:  Minimal: No identifiable suicidal ideation.  Patients presenting with no risk factors but with morbid ruminations; may be classified as minimal risk based on the severity of the depressive symptoms  Principal Problem: <principal problem not specified> Discharge Diagnoses:  Patient Active Problem List   Diagnosis Date Noted  . 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 Alcohol dependence [F10.20] 05/17/2014  . Radius distal fracture [S52.509A] 01/19/2014  . HYPERLIPIDEMIA [E78.5] 12/23/2006  . SMOKER [Z72.0] 12/23/2006  . 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   Why:  Appt for assessment (med management and SA IOP) on this date at 10AM. Annice Pih will likely callyou to move this appt date up after your discharge so that you can be seen sooner. If you have any questions, please call Annice Pih at [phone]   Contact information:   [address],  [place], [place] [address] [phone] Fax: [phone]      Follow up with [facility].   Why:  Walk in between 8am-9am Monday through Friday for hospital follow-up/medication management IF NEEDED prior to your assessment at [facility].    Contact information:  [address]  [place], [place] [address] Phone: [phone] Fax: [phone]      Plan Of Care/Follow-up recommendations:  Activity:  as tolerated Diet:  regular Recommend complaince with  appointments and treatment. AA and support groups Is patient on multiple antipsychotic therapies at discharge:  No   Has Patient had three or more failed trials of antipsychotic monotherapy by history:  No  Recommended Plan for Multiple Antipsychotic Therapies: NA    Jamerius Boeckman 05/20/2014, 9:46 AM

## 2014-05-20 NOTE — Progress Notes (Signed)
Goals Group Notes  Group Topic/Focus The focus of this group is to help patients identify goals they want to work towards as well as identify strategies  That will help them accomplish their goals.  Participation Quality: good  Affect: flat  Cognitive:intact    Insight:  good  Engagement in Group: engaged  Additional Comments: PDuke RN [employer]

## 2014-05-20 NOTE — Progress Notes (Addendum)
Nrsg DC note Pt is readied for her DC. MD completes morning DC SRA and DC order in chart. Pt denies active SI within the past 24 hrs and she rates her feelings  Of depression, hopelessness and anxiety  As " 2/0/4' , respectively. She documents this on the morning daily assessment she completed.  She is given her DC AVS and it is reviewed with her in length by this nurse.Francis Dowse. SHe is given sample meds from pharmacy  And she states understanding of all info she has reviewed and this is evidenced by her repeating , in her own words,  " the vistaril is the medicine I take only if I get anxious and I feel like I  need it,,,,". Pt is educated re campral administration. Pt is advised that UNDER NO CIRCUMSTANCES ,  WHILE TAKING CAMPRAL,   THAT SHE TAKE ANYTHING THAT HAS ANY ALCOHOL in it, She is advised to read the contents on ANY MEDICATION that she is not 100 %  Sure have no alcohol in them. She is educated that to do so, while on po campral therapy, is quite dangerous and can cause her grave illness.   All belongings are returned to her  And she is given patient satisfaction survey to complete as desired. Pt is then walked off unit and dc'd from  Lobby per MD order.

## 2014-05-20 NOTE — Discharge Summary (Signed)
Physician Discharge Summary Note  Patient:  Kayla Bullock is an 49 y.o., female MRN:  [id number] DOB:  [birth year] Patient phone:  [phone] (home)  Patient address:   [address] [address],  Total Time spent with patient: 30 minutes  Date of Admission:  05/17/2014 Date of Discharge: 05/20/2014  Reason for Admission:  Substance abuse  Principal Problem: Alcohol-induced mood disorder Discharge Diagnoses: Patient Active Problem List   Diagnosis Date Noted  . 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 Alcohol dependence [F10.20] 05/17/2014  . Radius distal fracture [S52.509A] 01/19/2014  . HYPERLIPIDEMIA [E78.5] 12/23/2006  . SMOKER [Z72.0] 12/23/2006  . DYSFUNCTIONAL UTERINE BLEEDING [N94.9] 12/23/2006    Musculoskeletal: Strength & Muscle Tone:  Normal Gait & Station: Normal Patient leans: NA  Psychiatric Specialty Exam:  SEE SRA Physical Exam  Vitals reviewed. Psychiatric: Her mood appears anxious.    Review of Systems  Constitutional: Negative.   HENT: Negative.   Eyes: Negative.   Respiratory: Negative.   Cardiovascular: Negative.   Gastrointestinal: Negative.   Genitourinary: Negative.   Musculoskeletal: Negative.   Skin: Negative.   Neurological: Negative.   Psychiatric/Behavioral: The patient is nervous/anxious.     Blood pressure 93/76, pulse 111, temperature 98.6 F (37 C), temperature source Oral, resp. rate 18, height 5' 8.5" (1.74 m), weight 61.236 kg (135 lb).Body mass index is 20.23 kg/(m^2).   Past Medical History:  Past Medical History  Diagnosis Date  . GERD (gastroesophageal reflux disease)   . Iron deficiency anemia     Past Surgical History  Procedure Laterality Date  . Orif wrist fracture Right 01/19/2014    DVR plates  . Fracture surgery    . Orif wrist fracture Right 01/19/2014    Procedure: OPEN REDUCTION INTERNAL FIXATION (ORIF) WRIST FRACTURE;  Surgeon: Roseanne Kaufman, MD;  Location: [place];  Service:  Orthopedics;  Laterality: Right;  DVR plates   Family History:  Family History  Problem Relation Age of Onset  . Cancer Other   . Diabetes Other   . Hypertension Other   . Stroke Other   . CAD Other    Social History:  History  Alcohol Use  . Yes    Comment: 1 bottle of vodka per day      History  Drug Use No    History   Social History  . Marital Status: Married    Spouse Name: N/A    Number of Children: N/A  . Years of Education: N/A   Social History Main Topics  . Smoking status: Current Every Day Smoker -- 0.33 packs/day for 20 years    Types: Cigarettes  . Smokeless tobacco: Never Used  . Alcohol Use: Yes     Comment: 1 bottle of vodka per day   . Drug Use: No  . Sexual Activity: Yes   Other Topics Concern  . None   Social History Narrative    Past Psychiatric History: Hospitalizations:    Outpatient Care:  Denies  Substance Abuse Care:  Denies  Self-Mutilation:  Denies  Suicidal Attempts:  Denies  Violent Behaviors:  Denies   Risk to Self: What has been your use of drugs/alcohol within the last 12 months?: Daily alcohol use- 1/5 vodka Risk to Others:   Prior Inpatient Therapy:   Prior Outpatient Therapy:    Level of Care:  OP  Hospital Course:  49 Y/O female who states that things started to get out of control, Started to get "violent with her  drinking". States it is affecting her job. Has gotten worst since October. She fell and broke her right wrist. 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 States because she was not working her drinking escalted. Drinks about a fith a day or if not a 1.5 bottle of wine. States she would drink until she passes out but she is sure she has enough for the next day to curb the withdrawal. Before October she made sure that the bottle lasted two days. In April she states she realized she was getting "a little insane." States she was able to quit for 60 days. Sates when she went to [place] she left wanting to  drink. Started drinking since late teens, got serious drinking 15 years ago when her mother died of lung cancer.   At time of discharge, she  rated both depression and anxiety levels to be manageable and minimal.  She did well with the medications prescribed.  Denies physiological concerns/SI/HI/AVH at time of discharge.  She was adhere to medication compliance and outpatient treatment.     Consults:  psychiatry  Significant Diagnostic Studies:  labs: per ED  Discharge Vitals:   Blood pressure 93/76, pulse 111, temperature 98.6 F (37 C), temperature source Oral, resp. rate 18, height 5' 8.5" (1.74 m), weight 61.236 kg (135 lb). Body mass index is 20.23 kg/(m^2). Lab Results:   Results for orders placed or performed during the hospital encounter of 05/17/14 (from the past 72 hour(s))  Comprehensive metabolic panel     Status: Abnormal   Collection Time: 05/19/14  7:56 PM  Result Value Ref Range   Sodium 137 135 - 145 mmol/L   Potassium 3.6 3.5 - 5.1 mmol/L   Chloride 102 96 - 112 mmol/L   CO2 27 19 - 32 mmol/L   Glucose, Bld 102 (H) 70 - 99 mg/dL   BUN 9 6 - 23 mg/dL   Creatinine, Ser 0.59 0.50 - 1.10 mg/dL   Calcium 9.0 8.4 - 10.5 mg/dL   Total Protein 7.4 6.0 - 8.3 g/dL   Albumin 4.2 3.5 - 5.2 g/dL   AST 46 (H) 0 - 37 U/L   ALT 34 0 - 35 U/L   Alkaline Phosphatase 109 39 - 117 U/L   Total Bilirubin 0.6 0.3 - 1.2 mg/dL   GFR calc non Af Amer >90 >90 mL/min   GFR calc Af Amer >90 >90 mL/min    Comment: (NOTE) The eGFR has been calculated using the CKD EPI equation. This calculation has not been validated in all clinical situations. eGFR's persistently <90 mL/min signify possible Chronic Kidney Disease.    Anion gap 8 5 - 15    Comment: Performed at [facility]    Physical Findings: AIMS: Facial and Oral Movements Muscles of Facial Expression: None, normal Lips and Perioral Area: None, normal Jaw: None, normal Tongue: None, normal,Extremity  Movements Upper (arms, wrists, hands, fingers): None, normal Lower (legs, knees, ankles, toes): None, normal, Trunk Movements Neck, shoulders, hips: None, normal, Overall Severity Severity of abnormal movements (highest score from questions above): None, normal Incapacitation due to abnormal movements: None, normal Patient's awareness of abnormal movements (rate only patient's report): No Awareness, Dental Status Current problems with teeth and/or dentures?: No Does patient usually wear dentures?: No  CIWA:  CIWA-Ar Total: 1 COWS:  COWS Total Score: 7   See Psychiatric Specialty Exam and Suicide Risk Assessment completed by Attending Physician prior to discharge.  Discharge  destination:  Home  Is patient on multiple antipsychotic therapies at discharge:  No   Has Patient had three or more failed trials of antipsychotic monotherapy by history:  No    Recommended Plan for Multiple Antipsychotic Therapies: NA     Medication List    STOP taking these medications        cephALEXin 500 MG capsule  Commonly known as:  KEFLEX     methocarbamol 500 MG tablet  Commonly known as:  ROBAXIN     NON FORMULARY     omeprazole 20 MG capsule  Commonly known as:  PRILOSEC  Replaced by:  pantoprazole 40 MG tablet     oxyCODONE 5 MG immediate release tablet  Commonly known as:  Oxy IR/ROXICODONE     oxyCODONE-acetaminophen 5-325 MG per tablet  Commonly known as:  PERCOCET/ROXICET     promethazine 25 MG tablet  Commonly known as:  PHENERGAN      TAKE these medications      Indication   acamprosate 333 MG tablet  Commonly known as:  CAMPRAL  Take 2 tablets (666 mg total) by mouth 3 (three) times daily with meals.   Indication:  Excessive Use of Alcohol     hydrOXYzine 25 MG tablet  Commonly known as:  ATARAX/VISTARIL  Take 1 tablet (25 mg total) by mouth every 6 (six) hours as needed (anxiety/agitation or CIWA < or = 10).   Indication:  Anxiety Neurosis     nicotine  polacrilex 2 MG gum  Commonly known as:  NICORETTE  Take 1 each (2 mg total) by mouth as needed for smoking cessation.   Indication:  Nicotine Addiction     pantoprazole 40 MG tablet  Commonly known as:  PROTONIX  Take 1 tablet (40 mg total) by mouth daily.   Indication:  Gastroesophageal Reflux Disease     traZODone 100 MG tablet  Commonly known as:  DESYREL  Take 1 tablet (100 mg total) by mouth at bedtime and may repeat dose one time if needed.   Indication:  Trouble Sleeping       Follow-up Information    [facility] with [facility] On 06/23/2014.   Why:  Appt for assessment (med management and SA IOP) on this date at [facility] will likely callyou to move this appt date up after your discharge so that you can be seen sooner. If you have any questions, please call Kennyth Lose at [phone]   Contact information:   [address] [place],  [place], [place] [address] [phone] Fax: [phone]      Follow up with [facility].   Why:  Walk in between 8am-9am Monday through Friday for hospital follow-up/medication management IF NEEDED prior to your assessment at [facility].    Contact information:   [address] [place] [address] Phone: [phone] Fax: [phone]      Follow-up recommendations:  Activity:  as tol, diet as tol  Comments:  1.  Take all your medications as prescribed.              2.  Report any adverse side effects to outpatient provider.                       3.  Patient instructed to not use alcohol or illegal drugs while on prescription medicines.            4.  In the event of worsening symptoms,  instructed patient to call 911, the crisis hotline or go to nearest emergency room for evaluation of symptoms.  Total Discharge Time:  30 min  Signed: Kerrie Buffalo MAY, AGNP-BC 05/20/2014, 6:34 PM  I have examined the patient and agree with the discharge plan and findings

## 2014-05-20 NOTE — Progress Notes (Signed)
Psychoeducational Group Note  Date: 05/20/2014 Time:  1015  Group Topic/Focus:  Identifying Needs:   The focus of this group is to help patients identify their personal needs that have been historically problematic and identify healthy behaviors to address their needs.  Participation Level:  Active  Participation Quality:  Appropriate  Affect:  Appropriate  Cognitive:  Oriented  Insight:  Improving  Engagement in Group:  Engaged  Additional Comments:  Pt actively participated in group.   Tinzlee Craker A 

## 2014-05-24 NOTE — Progress Notes (Signed)
Patient Discharge Instructions:  After Visit Summary (AVS):   Faxed to:  05/24/14 Discharge Summary Note:   Faxed to:  05/24/14 Psychiatric Admission Assessment Note:   Faxed to:  05/24/14 Suicide Risk Assessment - Discharge Assessment:   Faxed to:  05/24/14 Faxed/Sent to the Next [facility] Care provider:  05/24/14 Faxed to [facility] @ [phone] Faxed to [facility] @ [phone] Jerelene ReddenSheena E Potter, 05/24/2014, 3:42 PM

## 2015-01-08 ENCOUNTER — Other Ambulatory Visit: Payer: Self-pay | Admitting: Obstetrics and Gynecology

## 2015-01-08 DIAGNOSIS — Z1231 Encounter for screening mammogram for malignant neoplasm of breast: Secondary | ICD-10-CM

## 2015-01-18 ENCOUNTER — Ambulatory Visit (HOSPITAL_COMMUNITY): Payer: Self-pay

## 2015-02-08 ENCOUNTER — Ambulatory Visit (HOSPITAL_COMMUNITY)
Admission: RE | Admit: 2015-02-08 | Discharge: 2015-02-08 | Disposition: A | Discharge status: Home / Self Care | Payer: Self-pay | Source: Ambulatory Visit | Attending: Obstetrics and Gynecology | Admitting: Obstetrics and Gynecology

## 2015-02-08 ENCOUNTER — Encounter (HOSPITAL_COMMUNITY): Payer: Self-pay

## 2015-02-08 VITALS — BP 108/64 | Temp 98.2°F | Ht 64.0 in | Wt 161.0 lb

## 2015-02-08 DIAGNOSIS — Z01419 Encounter for gynecological examination (general) (routine) without abnormal findings: Secondary | ICD-10-CM

## 2015-02-08 NOTE — Patient Instructions (Addendum)
Educational materials on self breast awareness given. Explained to Kayla Bullock that [facility]CP will cover Pap smears and HPV typing every 5 years unless has a history of abnormal Pap smears. [facility]. Appointment scheduled for Friday, February 09, 2015 at 0830. Patient aware of appointment and will be there. Let patient know the Breast Center will follow up with her within the next couple weeks with results by letter or phone. Smoking cessation discussed. Referred patient to the [facility] Q[facility] and gave resources to the free classes offered at the [facility]. Kayla Bullock verbalized understanding.  Marena Witts, Kathaleen Maserhristine Poll, RN 11:07 AM

## 2015-02-08 NOTE — Progress Notes (Signed)
No complaints today.  Pap Smear:  Completed Pap smear today. [place] Pap [place] and normal per patient. Per patient has no history of an abnormal. Pap smear. No Pap smear results in EPIC.  Physical exam: Breasts Breasts symmetrical. No skin abnormalities bilateral breasts. No nipple retraction bilateral breasts. No nipple discharge bilateral breasts. No lymphadenopathy. No lumps palpated bilateral breasts. No complaints of pain or tenderness on exam. Ref[facility] the [facility] for screening mammogram. Appointment scheduled for Friday, February 09, 2015 at 0830.          Pelvic/Bimanual   Ext Genitalia No lesions, no swelling and no discharge observed on external genitalia.         Vagina Vagina pink and normal texture. No lesions or discharge observed in vagina.          Cervix Cervix is present. Cervix pink and of normal texture. Small amount of whitish colored discharge observed on cervical os.       Uterus Uterus is present and palpable. Uterus in normal position and normal size.       Adnexae Bilateral ovaries present and palpable. No tenderness on palpation.        Rectovaginal No rectal exam completed today since patient had no rectal complaints. Large hemorrhoid observed on rectal area. Patient states hemorrhoid is painful at times and that is using Preparation H and Tucks pads to help with the discomfort.  Smoking cessation discussed. Referred patient to the [facility] Q[facility] and gave resources to the free classes offered at the [facility].

## 2015-02-14 LAB — CYTOLOGY - PAP

## 2015-02-20 ENCOUNTER — Telehealth (HOSPITAL_COMMUNITY): Payer: Self-pay | Admitting: *Deleted

## 2015-02-20 NOTE — Telephone Encounter (Signed)
Called patient and gave results to Pap smear and HPV typing. Let her know that both were negative and that her next Pap smear is due in 5 years. Patient verbalized understanding. 

## 2015-04-17 ENCOUNTER — Ambulatory Visit
Admission: RE | Admit: 2015-04-17 | Discharge: 2015-04-17 | Disposition: A | Discharge status: Home / Self Care | Payer: No Typology Code available for payment source | Source: Ambulatory Visit | Attending: Obstetrics and Gynecology | Admitting: Obstetrics and Gynecology

## 2015-04-17 DIAGNOSIS — Z1231 Encounter for screening mammogram for malignant neoplasm of breast: Secondary | ICD-10-CM

## 2021-04-10 ENCOUNTER — Emergency Department (HOSPITAL_BASED_OUTPATIENT_CLINIC_OR_DEPARTMENT_OTHER): Payer: Self-pay | Admitting: Radiology

## 2021-04-10 ENCOUNTER — Encounter (HOSPITAL_BASED_OUTPATIENT_CLINIC_OR_DEPARTMENT_OTHER): Payer: Self-pay | Admitting: *Deleted

## 2021-04-10 ENCOUNTER — Emergency Department (HOSPITAL_BASED_OUTPATIENT_CLINIC_OR_DEPARTMENT_OTHER)
Admission: EM | Admit: 2021-04-10 | Discharge: 2021-04-10 | Disposition: A | Discharge status: Home / Self Care | Payer: Self-pay | Attending: Emergency Medicine | Admitting: Emergency Medicine

## 2021-04-10 ENCOUNTER — Other Ambulatory Visit: Payer: Self-pay

## 2021-04-10 DIAGNOSIS — W010XXA Fall on same level from slipping, tripping and stumbling without subsequent striking against object, initial encounter: Secondary | ICD-10-CM | POA: Insufficient documentation

## 2021-04-10 DIAGNOSIS — Z87891 Personal history of nicotine dependence: Secondary | ICD-10-CM | POA: Insufficient documentation

## 2021-04-10 DIAGNOSIS — W19XXXA Unspecified fall, initial encounter: Secondary | ICD-10-CM

## 2021-04-10 DIAGNOSIS — M79672 Pain in left foot: Secondary | ICD-10-CM | POA: Insufficient documentation

## 2021-04-10 MED ORDER — LIDOCAINE 5 % EX PTCH: 1.0000 | MEDICATED_PATCH | CUTANEOUS | 0 refills | Status: AC

## 2021-04-10 NOTE — ED Triage Notes (Signed)
Tripped and fell last week, continues to have pain in left foot

## 2021-04-10 NOTE — Discharge Instructions (Addendum)
Follow-up with orthopedics if your symptoms are not improved over the next week

## 2021-04-10 NOTE — ED Provider Notes (Signed)
[facility] EMERGENCY DEPT Provider Note   CSN: [id number] Arrival date & time: 04/10/21  1643    History Chief Complaint  Patient presents with   left foot pain    Kayla Bullock is a 55 y.o. female with past medical history who presents for evaluation of left foot pain.  Tripped and fell last week.  Continues to have pain to her left lateral aspect foot fifth metatarsal.  Denies hitting head, LOC anticoagulation.  She has been able to walk however has pain to foot.  No radicular symptoms.  No paresthesias, bruising, swelling.  Denies additional aggravating or alleviating factors.  History obtained from patient and past medical records.  No interpreter used.  HPI     Past Medical History:  Diagnosis Date   GERD (gastroesophageal reflux disease)    Iron deficiency anemia     Patient Active Problem List   Diagnosis Date Noted   Alcohol-induced mood disorder (HCC) 05/17/2014   Alcohol dependence (HCC) 05/17/2014   Radius distal fracture 01/19/2014   HYPERLIPIDEMIA 12/23/2006   SMOKER 12/23/2006   DYSFUNCTIONAL UTERINE BLEEDING 12/23/2006    Past Surgical History:  Procedure Laterality Date   FRACTURE SURGERY     ORIF WRIST FRACTURE Right 01/19/2014   DVR plates   ORIF WRIST FRACTURE Right 01/19/2014   Procedure: OPEN REDUCTION INTERNAL FIXATION (ORIF) WRIST FRACTURE;  Surgeon: Dominica Severin, MD;  [facility];  Service: Orthopedics;  Laterality: Right;  DVR plates     OB History     Gravida  1   Para  1   Term  1   Preterm      AB      Living  1      SAB      IAB      Ectopic      Multiple      Live Births              Family History  Problem Relation Age of Onset   Cancer Other    Diabetes Other    Hypertension Other    Stroke Other    CAD Other    Cancer Mother        lung   Cancer Maternal Grandfather        bone   Cancer Paternal Grandmother        cervical   Diabetes Paternal Grandfather     Social  History   Tobacco Use   Smoking status: Former    Packs/day: 0.33    Years: 20.00    Pack years: 6.60    Types: Cigarettes   Smokeless tobacco: Never  Vaping Use   Vaping Use: Every day  Substance Use Topics   Alcohol use: No    Comment: 10 months sober as of Nov 2   Drug use: No    Home Medications Prior to Admission medications   Medication Sig Start Date End Date Taking? Authorizing Provider  lidocaine (LIDODERM) 5 % Place 1 patch onto the skin daily. Remove & Discard patch within 12 hours or as directed by MD 04/10/21  Yes Shonette Rhames A, PA-C  citalopram (CELEXA) 40 MG tablet Take 40 mg by mouth daily.    [provider]  hydrOXYzine (ATARAX/VISTARIL) 25 MG tablet Take 1 tablet (25 mg total) by mouth every 6 (six) hours as needed (anxiety/agitation or CIWA < or = 10). 05/20/14   Adonis Brook, NP  nicotine polacrilex (NICORETTE) 2 MG gum Take 1 each (  2 mg total) by mouth as needed for smoking cessation. Patient not taking: Reported on 02/08/2015 05/20/14   Adonis Brook, NP    Allergies    Patient has no known allergies.  Review of Systems   Review of Systems  Constitutional: Negative.   HENT: Negative.    Respiratory: Negative.    Cardiovascular: Negative.   Gastrointestinal: Negative.   Genitourinary: Negative.   Musculoskeletal:        Left foot pain  Skin: Negative.   Neurological: Negative.   All other systems reviewed and are negative.  Physical Exam Updated Vital Signs BP (!) 156/85 (BP Location: Right Arm)    Pulse 68    Temp 98.2 F (36.8 C)    Resp 16    Ht 5\' 4"  (1.626 m)    Wt 62.6 kg    SpO2 100%    BMI 23.69 kg/m   Physical Exam Vitals and nursing note reviewed.  Constitutional:      General: She is not in acute distress.    Appearance: She is well-developed. She is not ill-appearing.  HENT:     Head: Normocephalic and atraumatic.  Eyes:     Pupils: Pupils are equal, round, and reactive to light.  Cardiovascular:     Rate and  Rhythm: Normal rate.     Pulses: Normal pulses.          Dorsalis pedis pulses are 2+ on the left side.       Posterior tibial pulses are 2+ on the left side.  Pulmonary:     Effort: No respiratory distress.  Abdominal:     General: There is no distension.  Musculoskeletal:        General: Normal range of motion.     Cervical back: Normal range of motion.     Right knee: Normal.     Left knee: Normal.     Right lower leg: Normal.     Left lower leg: Normal.     Right ankle: Normal.     Left ankle: Normal.     Right foot: Normal.     Left foot: Tenderness present.       Feet:     Comments: Tenderness proximal had fifth metatarsal.  Nontender bilateral malleoli.  Able to plantarflex and dorsiflex without difficulty.  Nontender plantar aspect of foot.  Nontender bilateral tib-fib.  Skin:    General: Skin is warm and dry.     Capillary Refill: Capillary refill takes less than 2 seconds.     Comments: Intact sensation, no ecchymosis  Neurological:     General: No focal deficit present.     Mental Status: She is alert.     Sensory: Sensation is intact.     Motor: Motor function is intact.     Gait: Gait is intact.     Comments: Ambulatory with limp, full range of motion  Psychiatric:        Mood and Affect: Mood normal.    ED Results / Procedures / Treatments   Labs (all labs ordered are listed, but only abnormal results are displayed) Labs Reviewed - No data to display  EKG None  Radiology DG Foot Complete Left  Result Date: 04/10/2021 CLINICAL DATA:  Tripped and fell last week. Pain in left foot. Cannot weight bear. EXAM: LEFT FOOT - COMPLETE 3+ VIEW COMPARISON:  None. FINDINGS: There is no evidence of fracture or dislocation. There is no evidence of arthropathy or other focal bone abnormality. Plantar  heel spur noted. Soft tissues are unremarkable. IMPRESSION: Negative. Electronically Signed   By: Signa Kell M.D.   On: 04/10/2021 17:29    Procedures .Splint  Application  Date/Time: 04/10/2021 9:20 PM Performed by: Linwood Dibbles, PA-C Authorized by: Linwood Dibbles, PA-C   Consent:    Consent obtained:  Verbal   Consent given by:  Patient   Risks, benefits, and alternatives were discussed: yes     Risks discussed:  Discoloration, numbness, pain and swelling   Alternatives discussed:  No treatment, delayed treatment, alternative treatment, observation and referral Universal protocol:    Procedure explained and questions answered to patient or proxy's satisfaction: yes     Relevant documents present and verified: yes     Test results available: yes     Imaging studies available: yes     Required blood products, implants, devices, and special equipment available: yes     Site/side marked: yes     Immediately prior to procedure a time out was called: yes     Patient identity confirmed:  Verbally with patient Pre-procedure details:    Distal neurologic exam:  Normal   Distal perfusion: distal pulses strong and brisk capillary refill   Procedure details:    Location:  Foot   Foot location:  L foot   Strapping: no     Lower extremity splint type: ASO brace.   Attestation: Splint applied and adjusted personally by me   Post-procedure details:    Distal neurologic exam:  Normal   Distal perfusion: distal pulses strong and brisk capillary refill     Procedure completion:  Tolerated well, no immediate complications   Medications Ordered in ED Medications - No data to display  ED Course  I have reviewed the triage vital signs and the nursing notes.  Pertinent labs & imaging results that were available during my care of the patient were reviewed by me and considered in my medical decision making (see chart for details).  Pleasant 55 year old here for evaluation of left foot pain after trip and fall 1 week ago.  She is neurovascularly intact.  Compartments soft.  No overlying skin changes.  Does have some tenderness to head fifth  metatarsal.  Skin warm, well-perfused.  Compartments soft.  X-ray obtained here does not show evidence of fracture, dislocation.  Suspect sprain or strain.  Placed in ASO brace, encouraged RICE for symptomatic management, follow-up orthopedics.  She is agreeable.  The patient has been appropriately medically screened and/or stabilized in the ED. I have low suspicion for any other emergent medical condition which would require further screening, evaluation or treatment in the ED or require inpatient management.  Patient is hemodynamically stable and in no acute distress.  Patient able to ambulate in department prior to ED.  Evaluation does not show acute pathology that would require ongoing or additional emergent interventions while in the emergency department or further inpatient treatment.  I have discussed the diagnosis with the patient and answered all questions.  Pain is been managed while in the emergency department and patient has no further complaints prior to discharge.  Patient is comfortable with plan discussed in room and is stable for discharge at this time.  I have discussed strict return precautions for returning to the emergency department.  Patient was encouraged to follow-up with PCP/specialist refer to at discharge.     MDM Rules/Calculators/A&P  Final Clinical Impression(s) / ED Diagnoses Final diagnoses:  Fall  Left foot pain    Rx / DC Orders ED Discharge Orders          Ordered    lidocaine (LIDODERM) 5 %  Every 24 hours        04/10/21 2115             Mayo Owczarzak A, PA-C 04/10/21 2121    Benjiman Core, MD 04/11/21 0013

## 2022-02-20 DIAGNOSIS — M069 Rheumatoid arthritis, unspecified: Secondary | ICD-10-CM | POA: Diagnosis not present

## 2022-02-20 DIAGNOSIS — F411 Generalized anxiety disorder: Secondary | ICD-10-CM | POA: Diagnosis not present

## 2022-03-27 DIAGNOSIS — F411 Generalized anxiety disorder: Secondary | ICD-10-CM | POA: Diagnosis not present

## 2022-04-22 DIAGNOSIS — M2540 Effusion, unspecified joint: Secondary | ICD-10-CM | POA: Diagnosis not present

## 2022-04-22 DIAGNOSIS — M79641 Pain in right hand: Secondary | ICD-10-CM | POA: Diagnosis not present

## 2022-04-22 DIAGNOSIS — R5383 Other fatigue: Secondary | ICD-10-CM | POA: Diagnosis not present

## 2022-04-22 DIAGNOSIS — M2560 Stiffness of unspecified joint, not elsewhere classified: Secondary | ICD-10-CM | POA: Diagnosis not present

## 2022-04-22 DIAGNOSIS — M79642 Pain in left hand: Secondary | ICD-10-CM | POA: Diagnosis not present

## 2022-04-29 DIAGNOSIS — F411 Generalized anxiety disorder: Secondary | ICD-10-CM | POA: Diagnosis not present

## 2022-05-08 DIAGNOSIS — M2540 Effusion, unspecified joint: Secondary | ICD-10-CM | POA: Diagnosis not present

## 2022-05-08 DIAGNOSIS — M2560 Stiffness of unspecified joint, not elsewhere classified: Secondary | ICD-10-CM | POA: Diagnosis not present

## 2022-05-08 DIAGNOSIS — M1991 Primary osteoarthritis, unspecified site: Secondary | ICD-10-CM | POA: Diagnosis not present

## 2022-05-08 DIAGNOSIS — M79641 Pain in right hand: Secondary | ICD-10-CM | POA: Diagnosis not present

## 2022-05-27 ENCOUNTER — Other Ambulatory Visit: Payer: Self-pay

## 2022-05-27 ENCOUNTER — Encounter (HOSPITAL_BASED_OUTPATIENT_CLINIC_OR_DEPARTMENT_OTHER): Payer: Self-pay | Admitting: Emergency Medicine

## 2022-05-27 ENCOUNTER — Emergency Department (HOSPITAL_BASED_OUTPATIENT_CLINIC_OR_DEPARTMENT_OTHER)
Admission: EM | Admit: 2022-05-27 | Discharge: 2022-05-27 | Disposition: A | Discharge status: Home / Self Care | Payer: BC Managed Care – PPO | Attending: Emergency Medicine | Admitting: Emergency Medicine

## 2022-05-27 ENCOUNTER — Emergency Department (HOSPITAL_BASED_OUTPATIENT_CLINIC_OR_DEPARTMENT_OTHER): Payer: BC Managed Care – PPO

## 2022-05-27 ENCOUNTER — Other Ambulatory Visit (HOSPITAL_BASED_OUTPATIENT_CLINIC_OR_DEPARTMENT_OTHER): Payer: Self-pay

## 2022-05-27 DIAGNOSIS — K5730 Diverticulosis of large intestine without perforation or abscess without bleeding: Secondary | ICD-10-CM | POA: Diagnosis not present

## 2022-05-27 DIAGNOSIS — R109 Unspecified abdominal pain: Secondary | ICD-10-CM | POA: Diagnosis not present

## 2022-05-27 DIAGNOSIS — R319 Hematuria, unspecified: Secondary | ICD-10-CM | POA: Diagnosis not present

## 2022-05-27 DIAGNOSIS — I700 Atherosclerosis of aorta: Secondary | ICD-10-CM | POA: Diagnosis not present

## 2022-05-27 DIAGNOSIS — R82998 Other abnormal findings in urine: Secondary | ICD-10-CM | POA: Diagnosis not present

## 2022-05-27 DIAGNOSIS — R1010 Upper abdominal pain, unspecified: Secondary | ICD-10-CM | POA: Diagnosis not present

## 2022-05-27 LAB — BASIC METABOLIC PANEL
Anion gap: 9 (ref 5–15)
BUN: 19 mg/dL (ref 6–20)
CO2: 28 mmol/L (ref 22–32)
Calcium: 9.3 mg/dL (ref 8.9–10.3)
Chloride: 99 mmol/L (ref 98–111)
Creatinine, Ser: 0.84 mg/dL (ref 0.44–1.00)
GFR, Estimated: >60 mL/min (ref >60)
Glucose, Bld: 98 mg/dL (ref 70–99)
Potassium: 3.9 mmol/L (ref 3.5–5.1)
Sodium: 136 mmol/L (ref 135–145)

## 2022-05-27 LAB — HEPATIC FUNCTION PANEL
ALT: 13 U/L (ref 0–44)
AST: 18 U/L (ref 15–41)
Albumin: 3.8 g/dL (ref 3.5–5.0)
Alkaline Phosphatase: 63 U/L (ref 38–126)
Bilirubin, Direct: <0.1 mg/dL (ref 0.0–0.2)
Total Bilirubin: 0.4 mg/dL (ref 0.3–1.2)
Total Protein: 6.5 g/dL (ref 6.5–8.1)

## 2022-05-27 LAB — CBC
HCT: 38.9 % (ref 36.0–46.0)
Hemoglobin: 13.2 g/dL (ref 12.0–15.0)
MCH: 31.1 pg (ref 26.0–34.0)
MCHC: 33.9 g/dL (ref 30.0–36.0)
MCV: 91.7 fL (ref 80.0–100.0)
Platelets: 395 10*3/uL (ref 150–400)
RBC: 4.24 MIL/uL (ref 3.87–5.11)
RDW: 13.5 % (ref 11.5–15.5)
WBC: 8.3 10*3/uL (ref 4.0–10.5)
nRBC: 0 % (ref 0.0–0.2)

## 2022-05-27 LAB — URINALYSIS, ROUTINE W REFLEX MICROSCOPIC
Bilirubin Urine: NEGATIVE
Glucose, UA: NEGATIVE mg/dL
Ketones, ur: NEGATIVE mg/dL
Nitrite: NEGATIVE
Protein, ur: NEGATIVE mg/dL
Specific Gravity, Urine: 1.02 (ref 1.005–1.030)
pH: 5.5 (ref 5.0–8.0)

## 2022-05-27 LAB — URINALYSIS, MICROSCOPIC (REFLEX)

## 2022-05-27 LAB — TROPONIN I (HIGH SENSITIVITY): Troponin I (High Sensitivity): 3 ng/L (ref <18)

## 2022-05-27 MED ORDER — OXYCODONE-ACETAMINOPHEN 5-325 MG PO TABS
1.0000 | ORAL_TABLET | ORAL | Status: AC | PRN
  Administered 2022-05-27 (×2): 1 via ORAL
  Filled 2022-05-27 (×2): qty 1

## 2022-05-27 MED ORDER — HYDROCODONE-ACETAMINOPHEN 5-325 MG PO TABS: 1.0000 | ORAL_TABLET | Freq: Four times a day (QID) | ORAL | 0 refills | Status: DC | PRN

## 2022-05-27 MED ORDER — HYDROCODONE-ACETAMINOPHEN 5-325 MG PO TABS
1.0000 | ORAL_TABLET | Freq: Four times a day (QID) | ORAL | 0 refills | Status: DC | PRN
  Filled 2022-05-27: qty 10, 2d supply, fill #0

## 2022-05-27 MED ORDER — KETOROLAC TROMETHAMINE 30 MG/ML IJ SOLN
30.0000 mg | Freq: Once | INTRAMUSCULAR | Status: AC
  Administered 2022-05-27: 30 mg via INTRAVENOUS
  Filled 2022-05-27: qty 1

## 2022-05-27 MED ORDER — HYDROCODONE-ACETAMINOPHEN 5-325 MG PO TABS
1.0000 | ORAL_TABLET | Freq: Four times a day (QID) | ORAL | 0 refills | Status: AC | PRN
  Filled 2022-05-27: qty 14, 4d supply, fill #0

## 2022-05-27 MED ORDER — ONDANSETRON 4 MG PO TBDP
4.0000 mg | ORAL_TABLET | Freq: Once | ORAL | Status: AC
  Administered 2022-05-27: 4 mg via ORAL
  Filled 2022-05-27: qty 1

## 2022-05-27 NOTE — Discharge Instructions (Addendum)
Take the hydrocodone as directed.  Pick it up at the pharmacy here.  Keep your appointment with your primary care doctor as scheduled.  Workup here today including cardiac workup without any acute findings.  Return for any new or worse symptoms.

## 2022-05-27 NOTE — ED Provider Notes (Signed)
[facility] Provider Note   CSN: [id number] Arrival date & time: 05/27/22  1030     History {Add pertinent medical, surgical, social history, OB history to HPI:1} Chief Complaint  Patient presents with   Flank Pain    Kayla Bullock is a 57 y.o. female.  HPI       Home Medications Prior to Admission medications   Medication Sig Start Date End Date Taking? Authorizing Provider  citalopram (CELEXA) 40 MG tablet Take 40 mg by mouth daily.    [provider]  hydrOXYzine (ATARAX/VISTARIL) 25 MG tablet Take 1 tablet (25 mg total) by mouth every 6 (six) hours as needed (anxiety/agitation or CIWA < or = 10). 05/20/14   Kerrie Buffalo, NP  lidocaine (LIDODERM) 5 % Place 1 patch onto the skin daily. Remove & Discard patch within 12 hours or as directed by MD 04/10/21   Henderly, Britni A, PA-C  nicotine polacrilex (NICORETTE) 2 MG gum Take 1 each (2 mg total) by mouth as needed for smoking cessation. Patient not taking: Reported on 02/08/2015 05/20/14   Kerrie Buffalo, NP      Allergies    Patient has no known allergies.    Review of Systems   Review of Systems  Physical Exam Updated Vital Signs BP (!) 140/83   Pulse 69   Temp 98 F (36.7 C) (Oral)   Resp 20   Ht 5' 4"$  (1.626 m)   Wt 71.2 kg   SpO2 97%   BMI 26.95 kg/m  Physical Exam  ED Results / Procedures / Treatments   Labs (all labs ordered are listed, but only abnormal results are displayed) Labs Reviewed  URINALYSIS, ROUTINE W REFLEX MICROSCOPIC - Abnormal; Notable for the following components:      Result Value   Hgb urine dipstick TRACE (*)    Leukocytes,Ua TRACE (*)    All other components within normal limits  URINALYSIS, MICROSCOPIC (REFLEX) - Abnormal; Notable for the following components:   Bacteria, UA RARE (*)    All other components within normal limits  CBC  BASIC METABOLIC PANEL  TROPONIN I (HIGH SENSITIVITY)     EKG None  Radiology CT Renal Stone Study  Result Date: 05/27/2022 CLINICAL DATA:  Right flank pain since Friday concern for kidney stone EXAM: CT ABDOMEN AND PELVIS WITHOUT CONTRAST TECHNIQUE: Multidetector CT imaging of the abdomen and pelvis was performed following the standard protocol without IV contrast. RADIATION DOSE REDUCTION: This exam was performed according to the departmental dose-optimization program which includes automated exposure control, adjustment of the mA and/or kV according to patient size and/or use of iterative reconstruction technique. COMPARISON:  None Available. FINDINGS: Lower chest: No acute abnormality. Hepatobiliary: No solid liver abnormality is seen. No gallstones, gallbladder wall thickening, or biliary dilatation. Pancreas: Unremarkable. No pancreatic ductal dilatation or surrounding inflammatory changes. Spleen: Normal in size without significant abnormality. Adrenals/Urinary Tract: Adrenal glands are unremarkable. Kidneys are normal, without renal calculi, solid lesion, or hydronephrosis. Bladder is unremarkable. Stomach/Bowel: Stomach is within normal limits. Appendix appears normal. No evidence of bowel wall thickening, distention, or inflammatory changes. Sigmoid diverticula. Vascular/Lymphatic: Aortic atherosclerosis. No enlarged abdominal or pelvic lymph nodes. Reproductive: No mass or other significant abnormality. Other: No abdominal wall hernia or abnormality. No ascites. Musculoskeletal: No acute or significant osseous findings. IMPRESSION: 1. No acute noncontrast CT findings of the abdomen or pelvis to explain right flank pain. No urinary tract calculi or hydronephrosis. 2. Sigmoid diverticulosis without  evidence of acute diverticulitis. Aortic Atherosclerosis (ICD10-I70.0). Electronically Signed   By: Delanna Ahmadi M.D.   On: 05/27/2022 12:47    Procedures Procedures  {Document cardiac monitor, telemetry assessment procedure when  appropriate:1}  Medications Ordered in ED Medications  oxyCODONE-acetaminophen (PERCOCET/ROXICET) 5-325 MG per tablet 1 tablet (1 tablet Oral Given 05/27/22 1102)  ondansetron (ZOFRAN-ODT) disintegrating tablet 4 mg (4 mg Oral Given 05/27/22 1102)  ketorolac (TORADOL) 30 MG/ML injection 30 mg (30 mg Intravenous Given 05/27/22 1348)    ED Course/ Medical Decision Making/ A&P   {   Click here for ABCD2, HEART and other calculatorsREFRESH Note before signing :1}                          Medical Decision Making Amount and/or Complexity of Data Reviewed Labs: ordered.  Risk Prescription drug management.   ***  {Document critical care time when appropriate:1} {Document review of labs and clinical decision tools ie heart score, Chads2Vasc2 etc:1}  {Document your independent review of radiology images, and any outside records:1} {Document your discussion with family members, caretakers, and with consultants:1} {Document social determinants of health affecting pt's care:1} {Document your decision making why or why not admission, treatments were needed:1} Final Clinical Impression(s) / ED Diagnoses Final diagnoses:  None    Rx / DC Orders ED Discharge Orders     None

## 2022-05-27 NOTE — ED Triage Notes (Signed)
Pt here for R flank pain since Friday. Pt saw [place] [facility] today who told her it was probably a kidney stone. Pt reports Pain begins in lower back and radiates around R flank to R side abd. Pt denies urinary symptoms, no vomiting but has had constant nausea.

## 2022-05-27 NOTE — ED Notes (Signed)
Pt ambulated to [facility]

## 2022-05-27 NOTE — ED Provider Notes (Signed)
Patient's initial troponin was normal at 3.  Workup for the right flank pain negative.  Including CT scan.  Sounds that is probably musculoskeletal.  Patient has follow-up with primary later this week.  Will give her a course of hydrocodone.   Fredia Sorrow, MD 05/27/22 [phone]

## 2022-05-27 NOTE — ED Notes (Signed)
Reviewed discharge instructions and recommendations with pt. Pt aware of medication to pick up at pharmacy. Pt states understanding. Ambulatory at time of discharge with family

## 2022-05-27 NOTE — ED Notes (Signed)
Light green/Lavender drawn left in lab Urine specimen left in lab

## 2022-05-29 DIAGNOSIS — K2100 Gastro-esophageal reflux disease with esophagitis, without bleeding: Secondary | ICD-10-CM | POA: Diagnosis not present

## 2022-06-26 DIAGNOSIS — F411 Generalized anxiety disorder: Secondary | ICD-10-CM | POA: Diagnosis not present

## 2022-07-22 DIAGNOSIS — Z13220 Encounter for screening for lipoid disorders: Secondary | ICD-10-CM | POA: Diagnosis not present

## 2022-07-22 DIAGNOSIS — Z0000 Encounter for general adult medical examination without abnormal findings: Secondary | ICD-10-CM | POA: Diagnosis not present

## 2022-08-05 DIAGNOSIS — M2560 Stiffness of unspecified joint, not elsewhere classified: Secondary | ICD-10-CM | POA: Diagnosis not present

## 2022-08-05 DIAGNOSIS — M2540 Effusion, unspecified joint: Secondary | ICD-10-CM | POA: Diagnosis not present

## 2022-08-05 DIAGNOSIS — M79641 Pain in right hand: Secondary | ICD-10-CM | POA: Diagnosis not present

## 2022-08-05 DIAGNOSIS — M79642 Pain in left hand: Secondary | ICD-10-CM | POA: Diagnosis not present

## 2022-08-05 DIAGNOSIS — M1991 Primary osteoarthritis, unspecified site: Secondary | ICD-10-CM | POA: Diagnosis not present

## 2022-08-19 IMAGING — DX DG FOOT COMPLETE 3+V*L*
3 series · 3 of 3 positions shown · non-contrast
Comparison: None.

CLINICAL DATA: Tripped and fell last week. Pain in left foot.
Cannot weight bear.

EXAM:
LEFT FOOT - COMPLETE 3+ VIEW

[foot ap]
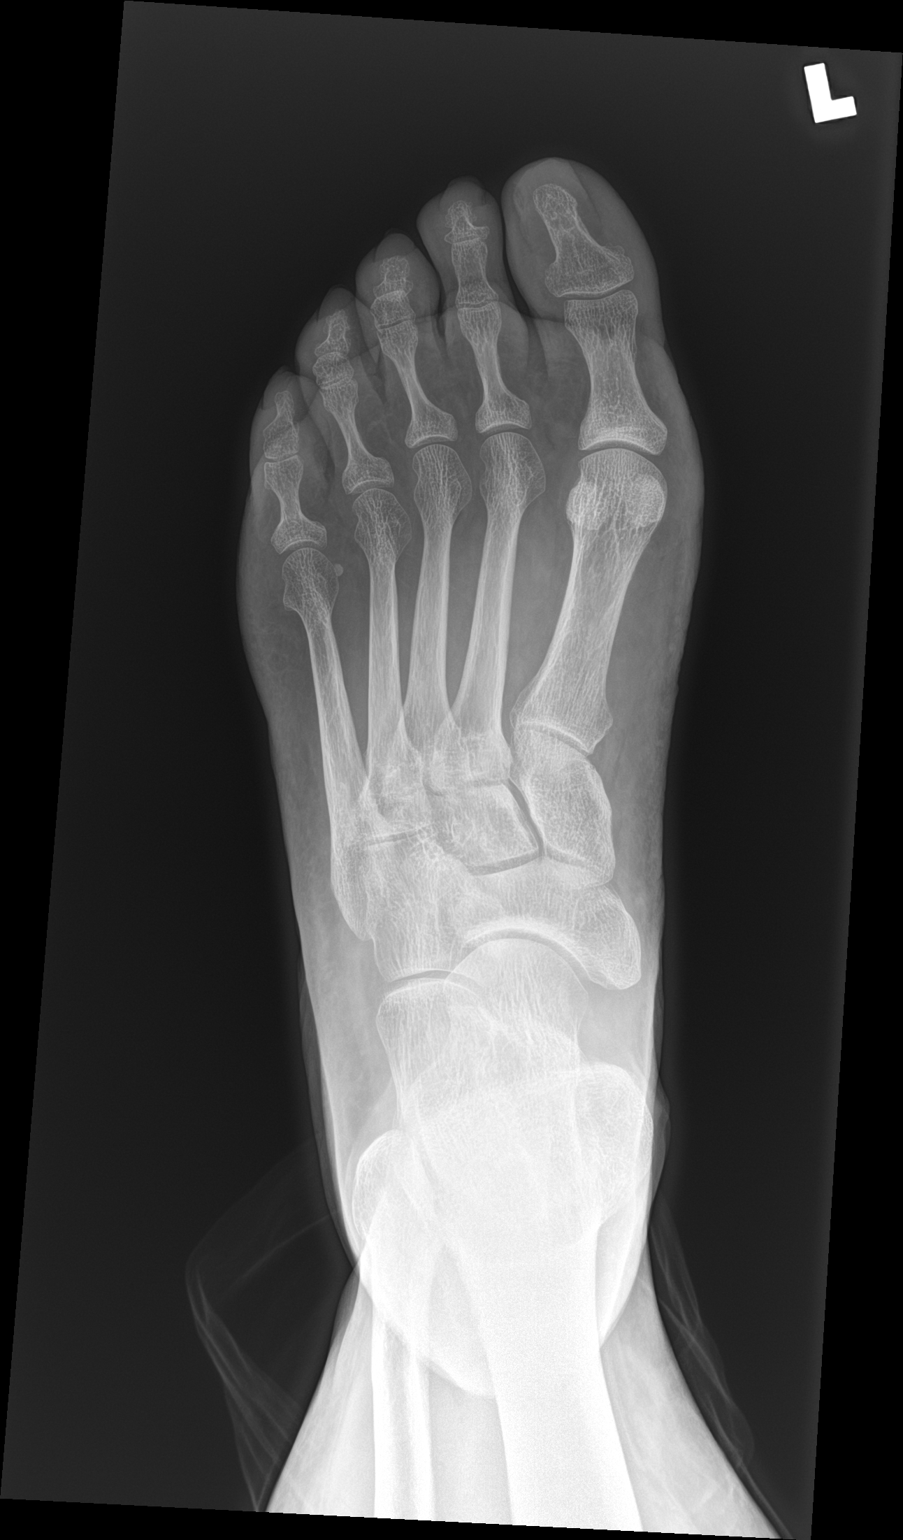

[foot obl]
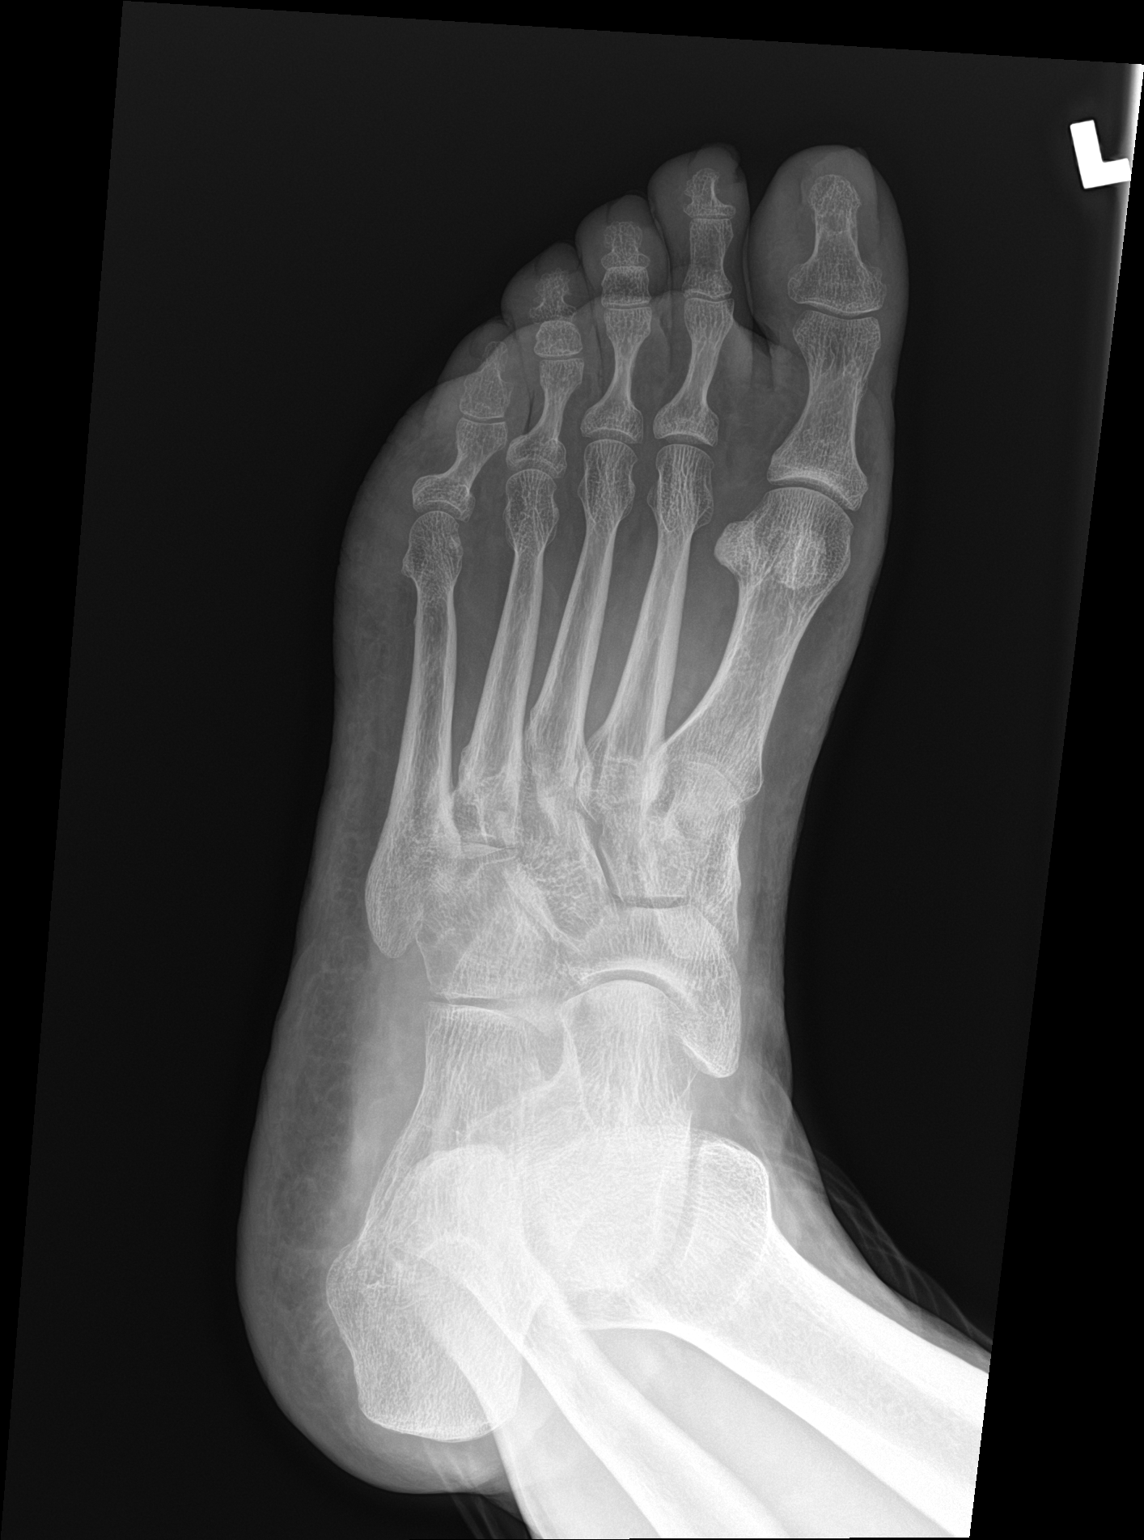

[foot lat]
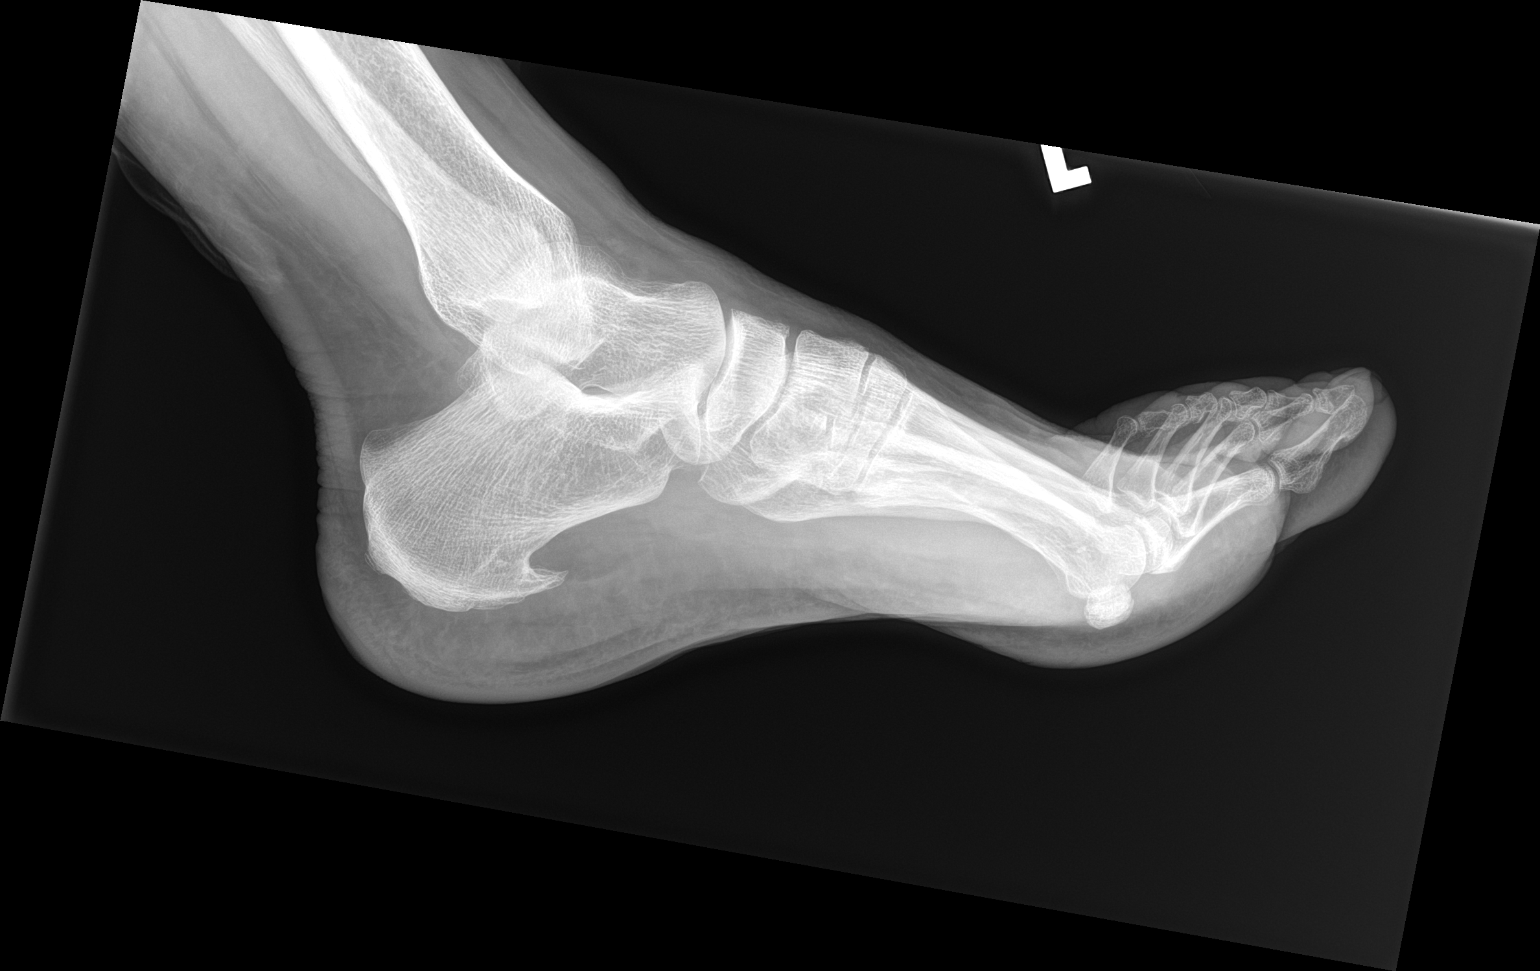

[3 of 3 positions shown; findings below may reference images not displayed]

FINDINGS: There is no evidence of fracture or dislocation. There is no
evidence of arthropathy or other focal bone abnormality. Plantar
heel spur noted. Soft tissues are unremarkable.
IMPRESSION: Negative.

## 2022-10-30 DIAGNOSIS — F411 Generalized anxiety disorder: Secondary | ICD-10-CM | POA: Diagnosis not present

## 2022-11-03 ENCOUNTER — Other Ambulatory Visit: Payer: Self-pay

## 2022-11-17 DIAGNOSIS — F411 Generalized anxiety disorder: Secondary | ICD-10-CM | POA: Diagnosis not present

## 2022-12-17 DIAGNOSIS — F411 Generalized anxiety disorder: Secondary | ICD-10-CM | POA: Diagnosis not present

## 2022-12-17 DIAGNOSIS — J0140 Acute pansinusitis, unspecified: Secondary | ICD-10-CM | POA: Diagnosis not present

## 2022-12-30 DIAGNOSIS — R070 Pain in throat: Secondary | ICD-10-CM | POA: Diagnosis not present

## 2023-01-05 DIAGNOSIS — H65191 Other acute nonsuppurative otitis media, right ear: Secondary | ICD-10-CM | POA: Diagnosis not present

## 2023-01-19 DIAGNOSIS — J1569 Pneumonia due to other gram-negative bacteria: Secondary | ICD-10-CM | POA: Diagnosis not present

## 2023-02-03 DIAGNOSIS — J1569 Pneumonia due to other gram-negative bacteria: Secondary | ICD-10-CM | POA: Diagnosis not present

## 2023-02-04 DIAGNOSIS — J1569 Pneumonia due to other gram-negative bacteria: Secondary | ICD-10-CM | POA: Diagnosis not present

## 2023-03-19 DIAGNOSIS — M0579 Rheumatoid arthritis with rheumatoid factor of multiple sites without organ or systems involvement: Secondary | ICD-10-CM | POA: Diagnosis not present

## 2023-03-19 DIAGNOSIS — R233 Spontaneous ecchymoses: Secondary | ICD-10-CM | POA: Diagnosis not present

## 2023-04-13 DIAGNOSIS — F411 Generalized anxiety disorder: Secondary | ICD-10-CM | POA: Diagnosis not present

## 2023-08-23 NOTE — Progress Notes (Deleted)
   Kayla Bullock, female    DOB: December 10, 1965   MRN: [id number]   Brief patient profile:  33 yowf   *** referred to pulmonary clinic 08/24/2023 by *** for ***      Pt not previously seen by PCCM service.    History of Present Illness  08/24/2023  Pulmonary/ 1st office eval/[facility]  No chief complaint on file.    Dyspnea:  *** Cough: *** Sleep: *** SABA use: *** 02 use:*** LDSCT:***  No obvious day to day or daytime pattern/variability or assoc excess/ purulent sputum or mucus plugs or hemoptysis or cp or chest tightness, subjective wheeze or overt sinus or hb symptoms.    Also denies any obvious fluctuation of symptoms with weather or environmental changes or other aggravating or alleviating factors except as outlined above   No unusual exposure hx or h/o childhood pna/ asthma or knowledge of premature birth.  Current Allergies, Complete Past Medical History, Past Surgical History, Family History, and Social History were reviewed in [employer] record.  ROS  The following are not active complaints unless bolded Hoarseness, sore throat, dysphagia, dental problems, itching, sneezing,  nasal congestion or discharge of excess mucus or purulent secretions, ear ache,   fever, chills, sweats, unintended wt loss or wt gain, classically pleuritic or exertional cp,  orthopnea pnd or arm/hand swelling  or leg swelling, presyncope, palpitations, abdominal pain, anorexia, nausea, vomiting, diarrhea  or change in bowel habits or change in bladder habits, change in stools or change in urine, dysuria, hematuria,  rash, arthralgias, visual complaints, headache, numbness, weakness or ataxia or problems with walking or coordination,  change in mood or  memory.             Outpatient Medications Prior to Visit  Medication Sig Dispense Refill   citalopram (CELEXA) 40 MG tablet Take 40 mg by mouth daily.     HYDROcodone -acetaminophen  (NORCO/VICODIN) 5-325 MG tablet Take 1 tablet by  mouth every 6 (six) hours as needed for moderate pain. 14 tablet 0   hydrOXYzine  (ATARAX /VISTARIL ) 25 MG tablet Take 1 tablet (25 mg total) by mouth every 6 (six) hours as needed (anxiety/agitation or CIWA < or = 10). 30 tablet 0   lidocaine  (LIDODERM ) 5 % Place 1 patch onto the skin daily. Remove & Discard patch within 12 hours or as directed by MD 30 patch 0   nicotine  polacrilex (NICORETTE ) 2 MG gum Take 1 each (2 mg total) by mouth as needed for smoking cessation. (Patient not taking: Reported on 02/08/2015) 100 tablet 0   No facility-administered medications prior to visit.    Past Medical History:  Diagnosis Date   GERD (gastroesophageal reflux disease)    Iron deficiency anemia       Objective:     There were no vitals taken for this visit.         Assessment   No problem-specific Assessment & Plan notes found for this encounter.     Vernestine Gondola, MD 08/23/2023

## 2023-08-24 ENCOUNTER — Ambulatory Visit: Admitting: Internal Medicine
